# Patient Record
Sex: Male | Born: 1990 | Race: Black or African American | Hispanic: No | Marital: Single | Smoking: Current every day smoker
Health system: Southern US, Community
[De-identification: ages and names within clinical notes are randomized; demographics above are authoritative.]

## PROBLEM LIST (undated history)

## (undated) DIAGNOSIS — J45909 Unspecified asthma, uncomplicated: Secondary | ICD-10-CM

## (undated) HISTORY — PX: APPENDECTOMY: SHX54

---

## 2017-06-17 ENCOUNTER — Encounter (HOSPITAL_COMMUNITY): Payer: Self-pay | Admitting: *Deleted

## 2017-06-17 ENCOUNTER — Emergency Department (HOSPITAL_COMMUNITY)
Admission: EM | Admit: 2017-06-17 | Discharge: 2017-06-17 | Disposition: A | Discharge status: Home / Self Care | Payer: Self-pay | Attending: Emergency Medicine | Admitting: Emergency Medicine

## 2017-06-17 ENCOUNTER — Emergency Department (HOSPITAL_COMMUNITY): Payer: Self-pay

## 2017-06-17 ENCOUNTER — Other Ambulatory Visit: Payer: Self-pay

## 2017-06-17 DIAGNOSIS — F17210 Nicotine dependence, cigarettes, uncomplicated: Secondary | ICD-10-CM | POA: Insufficient documentation

## 2017-06-17 DIAGNOSIS — R0789 Other chest pain: Secondary | ICD-10-CM | POA: Insufficient documentation

## 2017-06-17 LAB — BASIC METABOLIC PANEL
ANION GAP: 10 (ref 5–15)
BUN: 10 mg/dL (ref 6–20)
CALCIUM: 9.3 mg/dL (ref 8.9–10.3)
CO2: 23 mmol/L (ref 22–32)
Chloride: 104 mmol/L (ref 101–111)
Creatinine, Ser: 0.86 mg/dL (ref 0.61–1.24)
GFR calc Af Amer: >60 mL/min (ref >60)
GLUCOSE: 95 mg/dL (ref 65–99)
Potassium: 4.4 mmol/L (ref 3.5–5.1)
Sodium: 137 mmol/L (ref 135–145)

## 2017-06-17 LAB — CBC
HCT: 39.4 % (ref 39.0–52.0)
HEMOGLOBIN: 13.6 g/dL (ref 13.0–17.0)
MCH: 30.3 pg (ref 26.0–34.0)
MCHC: 34.5 g/dL (ref 30.0–36.0)
MCV: 87.8 fL (ref 78.0–100.0)
Platelets: 254 10*3/uL (ref 150–400)
RBC: 4.49 MIL/uL (ref 4.22–5.81)
RDW: 12.8 % (ref 11.5–15.5)
WBC: 7.9 10*3/uL (ref 4.0–10.5)

## 2017-06-17 LAB — I-STAT TROPONIN, ED
TROPONIN I, POC: 0 ng/mL (ref 0.00–0.08)
Troponin i, poc: 0 ng/mL (ref 0.00–0.08)

## 2017-06-17 MED ORDER — GI COCKTAIL ~~LOC~~
30.0000 mL | Freq: Once | ORAL | Status: AC
  Administered 2017-06-17: 30 mL via ORAL
  Filled 2017-06-17: qty 30

## 2017-06-17 MED ORDER — IBUPROFEN 600 MG PO TABS: 600.0000 mg | ORAL_TABLET | Freq: Four times a day (QID) | ORAL | 0 refills | Status: DC | PRN

## 2017-06-17 NOTE — ED Triage Notes (Signed)
Pt reports left side sharp chest pains for several days. Denies cough or sob. Hx of asthma. ekg done at triage, no acute distress is noted.

## 2017-06-17 NOTE — ED Provider Notes (Signed)
[facility] EMERGENCY DEPARTMENT Provider Note   CSN: [id number] Arrival date & time: 06/17/17  1212     History   Chief Complaint Chief Complaint  Patient presents with  . Chest Pain    HPI David Lloyd is a 27 y.o. male.  HPI   27 year old male without any significant past medical history except for asthma presenting with complaints of chest pain.  Patient report for the past week he has had recurrent pain to his left chest.  Described pain as a sharp sensation, episodic, usually lasting for most the day, nonradiating, and change in intensity.  No significant pain currently.  Denies any associated lightheadedness, dizziness, fever, productive cough, shortness of breath, wheezing, nausea, diaphoresis, or arm pain.  He denies any recent strenuous activities or heavy lifting, no recent injury.  He has not had this pain in the past.  He felt that that chest pain is likely related to him quitting smoking recently.  Smoker for approximately 10 years.  Did report remote history of drug use but none recently.  Denies any prior history of PE DVT, no recent surgery, prolonged bed rest, unilateral swelling or calf pain.  Denies any strong family history of cardiac disease or any premature cardiac death.  Denies any specific treatment tried.  History reviewed. No pertinent past medical history.  There are no active problems to display for this patient.   History reviewed. No pertinent surgical history.     Home Medications    Prior to Admission medications   Not on File    Family History History reviewed. No pertinent family history.  Social History Social History   Tobacco Use  . Smoking status: Current Some Day Smoker  Substance Use Topics  . Alcohol use: No    Frequency: Never  . Drug use: No     Allergies   Patient has no known allergies.   Review of Systems Review of Systems  All other systems reviewed and are negative.    Physical  Exam Updated Vital Signs BP 110/77   Pulse 63   Temp 98.9 F (37.2 C) (Oral)   Resp 18   SpO2 97%   Physical Exam  Constitutional: He appears well-developed and well-nourished. No distress.  HENT:  Head: Atraumatic.  Eyes: Conjunctivae are normal.  Neck: Neck supple.  Cardiovascular: Normal rate, regular rhythm, intact distal pulses and normal pulses.  Pulmonary/Chest: Effort normal and breath sounds normal. No accessory muscle usage. He has no decreased breath sounds. He has no wheezes. He has no rhonchi. He has no rales.  Abdominal: Soft.  Musculoskeletal:       Right lower leg: He exhibits no edema.       Left lower leg: He exhibits no edema.  Neurological: He is alert.  Skin: Skin is warm. No rash noted.  Psychiatric: He has a normal mood and affect.  Nursing note and vitals reviewed.    ED Treatments / Results  Labs (all labs ordered are listed, but only abnormal results are displayed) Labs Reviewed  BASIC METABOLIC PANEL  CBC  I-STAT TROPONIN, ED  I-STAT TROPONIN, ED    EKG  EKG Interpretation None     ED ECG REPORT   Date: 06/17/2017  Rate: 78  Rhythm: normal sinus rhythm  QRS Axis: normal  Intervals: normal  ST/T Wave abnormalities: nonspecific T wave changes  Conduction Disutrbances:none  Narrative Interpretation:   Old EKG Reviewed: none available  I have personally reviewed the EKG tracing  and agree with the computerized printout as noted.   Radiology Dg Chest 2 View  Result Date: 06/17/2017 CLINICAL DATA:  [name] left sided chest pains intermittently over the past 2 days; No known heart or respiratory problems; No SOB or any other distress EXAM: CHEST  2 VIEW COMPARISON:  None. FINDINGS: The heart size and mediastinal contours are within normal limits. Both lungs are clear. No pleural effusion or pneumothorax. The visualized skeletal structures are unremarkable. IMPRESSION: Normal chest radiographs. Electronically Signed   By: Amie Portlandavid  Ormond M.D.    On: 06/17/2017 12:57    Procedures Procedures (including critical care time)  Medications Ordered in ED Medications  gi cocktail (Maalox,Lidocaine,Donnatal) (30 mLs Oral Given 06/17/17 1614)     Initial Impression / Assessment and Plan / ED Course  I have reviewed the triage vital signs and the nursing notes.  Pertinent labs & imaging results that were available during my care of the patient were reviewed by me and considered in my medical decision making (see chart for details).     BP 105/66   Pulse 83   Temp 98.9 F (37.2 C) (Oral)   Resp 18   SpO2 98%    Final Clinical Impressions(s) / ED Diagnoses   Final diagnoses:  Atypical chest pain    ED Discharge Orders        Ordered    ibuprofen (ADVIL,MOTRIN) 600 MG tablet  Every 6 hours PRN     06/17/17 1719     2:22 PM Patient here with sharp left-sided chest pain atypical for ACS. PERC nergative, doubt PE.  HEART score of 2, low risk of MACE.  EKG did show non specific T wave changes, will obtain delta trop. Pt otherwise well appearing, in no acute discomfort.  No chest wall pain.   5:18 PM Labs are reassuring, normal delta troponin, chest x-ray is unremarkable, normal heart size no evidence of pneumonia, pleural effusion or pneumothorax.  Patient's pain does not change with position therefore low suspicion for pericarditis. No changes in pain after GI cocktail.  He is afebrile vital signs stable.  At this time encourage patient to find a primary care provider for further evaluation of his condition.  Return precautions discussed.  Will prescribe NSAIDs.   Fayrene Helperran, Oracio Galen, PA-C 06/17/17 1722    Rolland PorterJames, Mark, MD 06/18/17 1131

## 2017-07-31 ENCOUNTER — Ambulatory Visit: Payer: Self-pay | Admitting: Internal Medicine

## 2017-08-05 ENCOUNTER — Encounter (HOSPITAL_COMMUNITY): Payer: Self-pay

## 2017-08-05 ENCOUNTER — Emergency Department (HOSPITAL_COMMUNITY)
Admission: EM | Admit: 2017-08-05 | Discharge: 2017-08-05 | Disposition: A | Discharge status: Home / Self Care | Payer: Self-pay | Attending: Emergency Medicine | Admitting: Emergency Medicine

## 2017-08-05 DIAGNOSIS — R062 Wheezing: Secondary | ICD-10-CM | POA: Insufficient documentation

## 2017-08-05 DIAGNOSIS — J45909 Unspecified asthma, uncomplicated: Secondary | ICD-10-CM | POA: Insufficient documentation

## 2017-08-05 DIAGNOSIS — Z5321 Procedure and treatment not carried out due to patient leaving prior to being seen by health care provider: Secondary | ICD-10-CM | POA: Insufficient documentation

## 2017-08-05 DIAGNOSIS — R05 Cough: Secondary | ICD-10-CM | POA: Insufficient documentation

## 2017-08-05 HISTORY — DX: Unspecified asthma, uncomplicated: J45.909

## 2017-08-05 MED ORDER — ALBUTEROL SULFATE (2.5 MG/3ML) 0.083% IN NEBU
5.0000 mg | INHALATION_SOLUTION | Freq: Once | RESPIRATORY_TRACT | Status: AC
  Administered 2017-08-05: 5 mg via RESPIRATORY_TRACT
  Filled 2017-08-05: qty 6

## 2017-08-05 NOTE — ED Notes (Signed)
Patient called 2x. No answer.

## 2017-08-05 NOTE — ED Triage Notes (Signed)
Pt states that he has a hx of asthma and began coughing and wheezing an hour ago with chest tightness, pt does not have rescue inhaler

## 2017-08-08 NOTE — ED Notes (Signed)
08/08/2017, Follow- up call completed 

## 2017-08-28 ENCOUNTER — Ambulatory Visit: Payer: Self-pay | Admitting: Internal Medicine

## 2017-09-05 ENCOUNTER — Encounter (HOSPITAL_COMMUNITY): Payer: Self-pay

## 2017-09-05 ENCOUNTER — Other Ambulatory Visit: Payer: Self-pay

## 2017-09-05 ENCOUNTER — Emergency Department (HOSPITAL_COMMUNITY)
Admission: EM | Admit: 2017-09-05 | Discharge: 2017-09-05 | Discharge status: Against Medical Advice | Payer: Self-pay | Attending: Emergency Medicine | Admitting: Emergency Medicine

## 2017-09-05 DIAGNOSIS — F1010 Alcohol abuse, uncomplicated: Secondary | ICD-10-CM

## 2017-09-05 DIAGNOSIS — F10920 Alcohol use, unspecified with intoxication, uncomplicated: Secondary | ICD-10-CM

## 2017-09-05 DIAGNOSIS — J45909 Unspecified asthma, uncomplicated: Secondary | ICD-10-CM | POA: Insufficient documentation

## 2017-09-05 DIAGNOSIS — F17210 Nicotine dependence, cigarettes, uncomplicated: Secondary | ICD-10-CM | POA: Insufficient documentation

## 2017-09-05 DIAGNOSIS — F10120 Alcohol abuse with intoxication, uncomplicated: Secondary | ICD-10-CM | POA: Insufficient documentation

## 2017-09-05 NOTE — Discharge Instructions (Signed)
Return here as needed. °

## 2017-09-05 NOTE — ED Triage Notes (Signed)
He was seen by bystanders at a local park to be "passed out". They phoned EMS, who arrived to find the pt. In no distress and in a state of significant intoxication. He is able to briefly stand to get upon our stretcher.

## 2017-09-05 NOTE — ED Notes (Signed)
Pt ambulatory with assistance to restroom

## 2017-09-05 NOTE — ED Notes (Signed)
Patient spoke to PA and was told he would be getting discharged. Patient left without discharge instructions or without telling his nurse. Patient was medically cleared.

## 2017-09-05 NOTE — ED Provider Notes (Signed)
[facility]-EMERGENCY DEPT Provider Note   CSN: [id number] Arrival date & time: 09/05/17  1712     History   Chief Complaint Chief Complaint  Patient presents with  . Alcohol Intoxication    HPI David Lloyd is a 27 y.o. male.  HPI Patient presents to the emergency department with alcohol intoxication.  The patient states that he had 7 beers and passed out on [place] trail [place] local park.  The patient states that someone passed by him and woke him up and said they called EMS for him.  The patient states he had no injuries.  He states that he usually drinks every day.  Patient states he has no complaints at this time. Past Medical History:  Diagnosis Date  . Asthma     There are no active problems to display for this patient.   Past Surgical History:  Procedure Laterality Date  . APPENDECTOMY          Home Medications    Prior to Admission medications   Medication Sig Start Date End Date Taking? Authorizing Provider  ibuprofen (ADVIL,MOTRIN) 200 MG tablet Take 200 mg by mouth every 6 (six) hours as needed for mild pain or moderate pain.   Yes [provider]  ibuprofen (ADVIL,MOTRIN) 600 MG tablet Take 1 tablet (600 mg total) by mouth every 6 (six) hours as needed for mild pain. Patient not taking: Reported on 09/05/2017 06/17/17   Fayrene Helper, PA-C    Family History No family history on file.  Social History Social History   Tobacco Use  . Smoking status: Current Some Day Smoker  . Smokeless tobacco: Never Used  Substance Use Topics  . Alcohol use: Yes    Frequency: Never  . Drug use: No     Allergies   Patient has no known allergies.   Review of Systems Review of Systems All other systems negative except as documented in the HPI. All pertinent positives and negatives as reviewed in the HPI.  Physical Exam Updated Vital Signs BP 126/83 (BP Location: Right Arm)   Pulse 92   Temp 98 F (36.7 C) (Oral)   Resp 17   SpO2  97%   Physical Exam  Constitutional: He is oriented to person, place, and time. He appears well-developed and well-nourished. No distress.  HENT:  Head: Normocephalic and atraumatic.  Mouth/Throat: Oropharynx is clear and moist.  Eyes: Pupils are equal, round, and reactive to light.  Neck: Normal range of motion. Neck supple.  Cardiovascular: Normal rate, regular rhythm and normal heart sounds. Exam reveals no gallop and no friction rub.  No murmur heard. Pulmonary/Chest: Effort normal and breath sounds normal. No respiratory distress. He has no wheezes.  Neurological: He is alert and oriented to person, place, and time. He exhibits normal muscle tone. Coordination normal.  Skin: Skin is warm and dry. Capillary refill takes less than 2 seconds. No rash noted. No erythema.  Psychiatric: He has a normal mood and affect. His behavior is normal.  Nursing note and vitals reviewed.    ED Treatments / Results  Labs (all labs ordered are listed, but only abnormal results are displayed) Labs Reviewed - No data to display  EKG None  Radiology No results found.  Procedures Procedures (including critical care time)  Medications Ordered in ED Medications - No data to display   Initial Impression / Assessment and Plan / ED Course  I have reviewed the triage vital signs and the nursing notes.  Pertinent labs & imaging results that were available during my care of the patient were reviewed by me and considered in my medical decision making (see chart for details).     Patient will be discharged as he is ambulating and mentating appropriately.  Patient is advised to return for any changes in his condition.  Final Clinical Impressions(s) / ED Diagnoses   Final diagnoses:  Alcoholic intoxication without complication [facility])  ETOH abuse    ED Discharge Orders    None       Charlestine Night, Cordelia Poche 09/05/17 2357    Benjiman Core, MD 09/06/17 [phone]

## 2017-09-06 ENCOUNTER — Encounter (HOSPITAL_COMMUNITY): Payer: Self-pay

## 2017-09-06 ENCOUNTER — Emergency Department (HOSPITAL_COMMUNITY)
Admission: EM | Admit: 2017-09-06 | Discharge: 2017-09-06 | Disposition: A | Discharge status: Home / Self Care | Payer: Medicaid Other | Attending: Emergency Medicine | Admitting: Emergency Medicine

## 2017-09-06 ENCOUNTER — Other Ambulatory Visit: Payer: Self-pay

## 2017-09-06 ENCOUNTER — Encounter (HOSPITAL_COMMUNITY): Payer: Self-pay | Admitting: Emergency Medicine

## 2017-09-06 ENCOUNTER — Emergency Department (HOSPITAL_COMMUNITY)
Admission: EM | Admit: 2017-09-06 | Discharge: 2017-09-06 | Disposition: A | Discharge status: Home / Self Care | Payer: Self-pay | Attending: Emergency Medicine | Admitting: Emergency Medicine

## 2017-09-06 DIAGNOSIS — F1010 Alcohol abuse, uncomplicated: Secondary | ICD-10-CM

## 2017-09-06 DIAGNOSIS — F17200 Nicotine dependence, unspecified, uncomplicated: Secondary | ICD-10-CM | POA: Insufficient documentation

## 2017-09-06 DIAGNOSIS — K0889 Other specified disorders of teeth and supporting structures: Secondary | ICD-10-CM

## 2017-09-06 DIAGNOSIS — F17210 Nicotine dependence, cigarettes, uncomplicated: Secondary | ICD-10-CM | POA: Insufficient documentation

## 2017-09-06 DIAGNOSIS — F321 Major depressive disorder, single episode, moderate: Secondary | ICD-10-CM

## 2017-09-06 DIAGNOSIS — J45909 Unspecified asthma, uncomplicated: Secondary | ICD-10-CM | POA: Insufficient documentation

## 2017-09-06 DIAGNOSIS — H1031 Unspecified acute conjunctivitis, right eye: Secondary | ICD-10-CM

## 2017-09-06 DIAGNOSIS — M79641 Pain in right hand: Secondary | ICD-10-CM

## 2017-09-06 MED ORDER — NAPROXEN 500 MG PO TABS: ORAL_TABLET | ORAL | 0 refills | Status: DC

## 2017-09-06 MED ORDER — AMOXICILLIN 500 MG PO CAPS: 500.0000 mg | ORAL_CAPSULE | Freq: Three times a day (TID) | ORAL | 0 refills | Status: DC

## 2017-09-06 MED ORDER — BUPIVACAINE-EPINEPHRINE (PF) 0.5% -1:200000 IJ SOLN
1.8000 mL | Freq: Once | INTRAMUSCULAR | Status: AC
  Administered 2017-09-06: 1.8 mL
  Filled 2017-09-06: qty 1.8

## 2017-09-06 MED ORDER — PENICILLIN V POTASSIUM 500 MG PO TABS
500.0000 mg | ORAL_TABLET | Freq: Once | ORAL | Status: AC
  Administered 2017-09-06: 500 mg via ORAL
  Filled 2017-09-06: qty 1

## 2017-09-06 MED ORDER — PENICILLIN V POTASSIUM 500 MG PO TABS: 500.0000 mg | ORAL_TABLET | Freq: Four times a day (QID) | ORAL | 0 refills | Status: AC

## 2017-09-06 NOTE — ED Notes (Signed)
Pt called to a room x 1 with no response.

## 2017-09-06 NOTE — ED Notes (Signed)
Pt called x 2 with no response. Unable to be located in the lobby

## 2017-09-06 NOTE — ED Triage Notes (Signed)
Per EMS: [facility] c/o toothache x a couple of days.

## 2017-09-06 NOTE — ED Provider Notes (Signed)
[facility]-EMERGENCY DEPT Provider Note: David Dell, MD, FACEP  CSN: [id number] MRN: [id number] ARRIVAL: 09/06/17 at 0153 ROOM: WA18/WA18   CHIEF COMPLAINT  Dental Pain   HISTORY OF PRESENT ILLNESS  09/06/17 5:40 AM David Lloyd is a 27 y.o. male with pain in the left lower third molar which began yesterday.  He rates his pain as a 7 out of 10.  It is making it hard to swallow.  There is adjacent gum swelling as well.  He has not taken anything for his pain.  He does not have a dentist.  There is no associated lymphadenopathy.    Past Medical History:  Diagnosis Date  . Asthma     Past Surgical History:  Procedure Laterality Date  . APPENDECTOMY      History reviewed. No pertinent family history.  Social History   Tobacco Use  . Smoking status: Current Some Day Smoker  . Smokeless tobacco: Never Used  Substance Use Topics  . Alcohol use: Yes    Frequency: Never  . Drug use: No    Prior to Admission medications   Medication Sig Start Date End Date Taking? Authorizing Provider  ibuprofen (ADVIL,MOTRIN) 200 MG tablet Take 200 mg by mouth every 6 (six) hours as needed for mild pain or moderate pain.   Yes [provider]  ibuprofen (ADVIL,MOTRIN) 600 MG tablet Take 1 tablet (600 mg total) by mouth every 6 (six) hours as needed for mild pain. Patient not taking: Reported on 09/05/2017 06/17/17   Fayrene Helper, PA-C    Allergies Patient has no known allergies.   REVIEW OF SYSTEMS  Negative except as noted here or in the History of Present Illness.   PHYSICAL EXAMINATION  Initial Vital Signs Blood pressure 133/79, pulse 68, temperature 98.4 F (36.9 C), temperature source Oral, resp. rate 18, SpO2 100 %.  Examination General: Well-developed, well-nourished male in no acute distress; appearance consistent with age of record HENT: normocephalic; atraumatic; partially impacted third molar; left lower third molar with tenderness to percussion and inflamed  adjacent gum tissue Eyes: pupils equal, round and reactive to light; extraocular muscles intact Neck: supple; no lymphadenopathy Heart: regular rate and rhythm Lungs: clear to auscultation bilaterally Abdomen: soft; nondistended Extremities: No deformity; full range of motion; pulses normal Neurologic: Awake, alert and oriented; motor function intact in all extremities and symmetric; no facial droop Skin: Warm and dry Psychiatric: Normal mood and affect   RESULTS  Summary of this visit's results, reviewed by myself:   EKG Interpretation  Date/Time:    Ventricular Rate:    PR Interval:    QRS Duration:   QT Interval:    QTC Calculation:   R Axis:     Text Interpretation:        Laboratory Studies: No results found for this or any previous visit (from the past 24 hour(s)). Imaging Studies: No results found.  ED COURSE and MDM  Nursing notes and initial vitals signs, including pulse oximetry, reviewed.  Vitals:   09/06/17 0206  BP: 133/79  Pulse: 68  Resp: 18  Temp: 98.4 F (36.9 C)  TempSrc: Oral  SpO2: 100%   Will place on antibiotics and naproxen and refer to oral surgery.  PROCEDURES   DENTAL BLOCK 1.8 milliliters of 0.5% bupivacaine with epinephrine were injected into the buccal fold adjacent to the left lower third molar. The patient tolerated this well and there were no immediate complications. Adequate analgesia was obtained.   ED DIAGNOSES  ICD-10-CM   1. Pain, dental K08.89        Laiyah Exline, MD 09/06/17 0600

## 2017-09-06 NOTE — ED Provider Notes (Signed)
[facility] EMERGENCY DEPARTMENT Provider Note   CSN: [id number] Arrival date & time: 09/06/17  1248     History   Chief Complaint Chief Complaint  Patient presents with  . Dental Pain    HPI David Lloyd is a 27 y.o. male.  The history is provided by the patient.  Dental Pain   This is a new problem. The current episode started 12 to 24 hours ago. The problem occurs constantly. The problem has been gradually worsening. The pain is moderate. The treatment provided moderate relief.   Pt complains of pain from his wisdom tooth.  Pt seen earlier. Pt has not filled RX Past Medical History:  Diagnosis Date  . Asthma     There are no active problems to display for this patient.   Past Surgical History:  Procedure Laterality Date  . APPENDECTOMY          Home Medications    Prior to Admission medications   Medication Sig Start Date End Date Taking? Authorizing Provider  amoxicillin (AMOXIL) 500 MG capsule Take 1 capsule (500 mg total) by mouth 3 (three) times daily. 09/06/17   Elson Areas, PA-C  naproxen (NAPROSYN) 500 MG tablet Take 1 tablet twice daily as needed for tooth pain. 09/06/17   Molpus, John, MD  penicillin v potassium (VEETID) 500 MG tablet Take 1 tablet (500 mg total) by mouth 4 (four) times daily for 7 days. 09/06/17 09/13/17  Molpus, Jonny Ruiz, MD    Family History No family history on file.  Social History Social History   Tobacco Use  . Smoking status: Current Some Day Smoker  . Smokeless tobacco: Never Used  Substance Use Topics  . Alcohol use: Yes    Frequency: Never  . Drug use: No     Allergies   Patient has no known allergies.   Review of Systems Review of Systems  HENT: Positive for dental problem.   All other systems reviewed and are negative.    Physical Exam Updated Vital Signs BP 114/72   Pulse 99   Temp 98.2 F (36.8 C) (Oral)   Resp 16   SpO2 99%   Physical Exam  Constitutional: He appears  well-developed and well-nourished.  HENT:  Head: Normocephalic and atraumatic.  Swelling left gumline tender   Eyes: Conjunctivae are normal.  Neck: Neck supple.  Cardiovascular: Normal rate and regular rhythm.  No murmur heard. Pulmonary/Chest: Effort normal and breath sounds normal. No respiratory distress.  Abdominal: There is no tenderness.  Musculoskeletal: He exhibits no edema.  Neurological: He is alert.  Skin: Skin is warm and dry.  Psychiatric: He has a normal mood and affect.  Nursing note and vitals reviewed.    ED Treatments / Results  Labs (all labs ordered are listed, but only abnormal results are displayed) Labs Reviewed - No data to display  EKG None  Radiology No results found.  Procedures Procedures (including critical care time)  Medications Ordered in ED Medications - No data to display   Initial Impression / Assessment and Plan / ED Course  I have reviewed the triage vital signs and the nursing notes.  Pertinent labs & imaging results that were available during my care of the patient were reviewed by me and considered in my medical decision making (see chart for details).     An After Visit Summary was printed and given to the patient.   Final Clinical Impressions(s) / ED Diagnoses   Final diagnoses:  Pain,  dental    ED Discharge Orders        Ordered    amoxicillin (AMOXIL) 500 MG capsule  3 times daily     09/06/17 1333       Osie Cheeks 09/06/17 1433    Loren Racer, MD 09/07/17 [phone]

## 2017-09-06 NOTE — ED Triage Notes (Signed)
Pt complains of dental pain. Seen for same at [facility] today and received dental block. Pt states he is still having pain and doesn't want to die because he cant eat d/t the pain

## 2017-09-06 NOTE — Discharge Instructions (Addendum)
Schedule to see a dentist for evaluation.   

## 2017-09-22 ENCOUNTER — Emergency Department (HOSPITAL_COMMUNITY)
Admission: EM | Admit: 2017-09-22 | Discharge: 2017-09-22 | Discharge status: Against Medical Advice | Payer: Medicaid Other

## 2017-09-22 NOTE — ED Notes (Signed)
No answer when called for triage 

## 2017-09-23 ENCOUNTER — Emergency Department (HOSPITAL_COMMUNITY): Payer: Medicaid Other

## 2017-09-23 ENCOUNTER — Emergency Department (HOSPITAL_COMMUNITY)
Admission: EM | Admit: 2017-09-23 | Discharge: 2017-09-23 | Disposition: A | Discharge status: Home / Self Care | Payer: Medicaid Other | Attending: Emergency Medicine | Admitting: Emergency Medicine

## 2017-09-23 ENCOUNTER — Encounter (HOSPITAL_COMMUNITY): Payer: Self-pay | Admitting: Nurse Practitioner

## 2017-09-23 DIAGNOSIS — M25572 Pain in left ankle and joints of left foot: Secondary | ICD-10-CM | POA: Insufficient documentation

## 2017-09-23 DIAGNOSIS — Z5321 Procedure and treatment not carried out due to patient leaving prior to being seen by health care provider: Secondary | ICD-10-CM | POA: Insufficient documentation

## 2017-09-23 NOTE — ED Triage Notes (Signed)
Pt is c/o left ankle pain and swelling that has been ongoing for the last 2 weeks.

## 2017-09-23 NOTE — ED Notes (Signed)
Pt called checked for the 3rd time, still no answer no scene in the waiting area.

## 2017-09-23 NOTE — ED Notes (Signed)
Pt called from the waiting to be roomed, no answer, inspected male lobby bathroom and outside pt unavailble

## 2017-09-23 NOTE — ED Notes (Signed)
Bed: WA03 Expected date:  Expected time:  Means of arrival:  Comments: 

## 2017-09-23 NOTE — ED Provider Notes (Cosign Needed)
Patient eloped from the emergency department before provider evaluation.  He was never physically placed in the room.  I did not see or evaluate this patient. Triage note states left ankle pain and swelling that has been ongoing for the last 2 weeks.   I did personally evaluate the images from his ankle x-ray ordered in triage.  No evidence of fracture or dislocation.   Dg Ankle Complete Left  Result Date: 09/23/2017 CLINICAL DATA:  Pain and swelling over the lateral ankle for over 2 weeks. No known injury. EXAM: LEFT ANKLE COMPLETE - 3+ VIEW COMPARISON:  None. FINDINGS: Soft tissue swelling is noted over the lateral malleolus and posterior to the ankle joint. No joint effusion, bone destruction or malalignment is identified. The included metatarsals appear intact. The subtalar joint is unremarkable. IMPRESSION: No acute osseous abnormality. Nonspecific soft tissue swelling over the lateral malleolus and posterior to the ankle joint involving Kager's fat pad. Electronically Signed   By: Tollie Ethavid  Kwon M.D.   On: 09/23/2017 23:25        Andrue Dini, Boyd KerbsHannah, PA-C 09/23/17 2343

## 2017-09-23 NOTE — ED Notes (Signed)
Rechecked in the waiting pt still unavailable.

## 2017-09-24 ENCOUNTER — Emergency Department (HOSPITAL_COMMUNITY)
Admission: EM | Admit: 2017-09-24 | Discharge: 2017-09-24 | Discharge status: Absent without leave | Payer: Medicaid Other

## 2017-09-24 NOTE — ED Notes (Signed)
CALLED PT FOR TRIAGE X1 NO RESPONSE 

## 2017-09-25 ENCOUNTER — Emergency Department (HOSPITAL_COMMUNITY)
Admission: EM | Admit: 2017-09-25 | Discharge: 2017-09-25 | Disposition: A | Discharge status: Home / Self Care | Payer: Medicaid Other | Attending: Emergency Medicine | Admitting: Emergency Medicine

## 2017-09-25 ENCOUNTER — Encounter (HOSPITAL_COMMUNITY): Payer: Self-pay

## 2017-09-25 DIAGNOSIS — F17200 Nicotine dependence, unspecified, uncomplicated: Secondary | ICD-10-CM | POA: Insufficient documentation

## 2017-09-25 DIAGNOSIS — Z79899 Other long term (current) drug therapy: Secondary | ICD-10-CM | POA: Insufficient documentation

## 2017-09-25 DIAGNOSIS — R45851 Suicidal ideations: Secondary | ICD-10-CM

## 2017-09-25 DIAGNOSIS — F32A Depression, unspecified: Secondary | ICD-10-CM

## 2017-09-25 DIAGNOSIS — J45909 Unspecified asthma, uncomplicated: Secondary | ICD-10-CM | POA: Insufficient documentation

## 2017-09-25 DIAGNOSIS — R440 Auditory hallucinations: Secondary | ICD-10-CM

## 2017-09-25 DIAGNOSIS — F329 Major depressive disorder, single episode, unspecified: Secondary | ICD-10-CM

## 2017-09-25 DIAGNOSIS — F209 Schizophrenia, unspecified: Secondary | ICD-10-CM

## 2017-09-25 LAB — COMPREHENSIVE METABOLIC PANEL
ALK PHOS: 76 U/L (ref 38–126)
ALT: 19 U/L (ref 17–63)
AST: 21 U/L (ref 15–41)
Albumin: 4.7 g/dL (ref 3.5–5.0)
Anion gap: 7 (ref 5–15)
BUN: 15 mg/dL (ref 6–20)
CALCIUM: 9.8 mg/dL (ref 8.9–10.3)
CO2: 28 mmol/L (ref 22–32)
CREATININE: 0.97 mg/dL (ref 0.61–1.24)
Chloride: 103 mmol/L (ref 101–111)
Glucose, Bld: 98 mg/dL (ref 65–99)
Potassium: 4.2 mmol/L (ref 3.5–5.1)
SODIUM: 138 mmol/L (ref 135–145)
Total Bilirubin: 0.4 mg/dL (ref 0.3–1.2)
Total Protein: 7.9 g/dL (ref 6.5–8.1)

## 2017-09-25 LAB — CBC
HCT: 45.2 % (ref 39.0–52.0)
Hemoglobin: 15.2 g/dL (ref 13.0–17.0)
MCH: 30.3 pg (ref 26.0–34.0)
MCHC: 33.6 g/dL (ref 30.0–36.0)
MCV: 90.2 fL (ref 78.0–100.0)
PLATELETS: 296 10*3/uL (ref 150–400)
RBC: 5.01 MIL/uL (ref 4.22–5.81)
RDW: 13.3 % (ref 11.5–15.5)
WBC: 8.9 10*3/uL (ref 4.0–10.5)

## 2017-09-25 LAB — RAPID URINE DRUG SCREEN, HOSP PERFORMED
Amphetamines: NOT DETECTED
BENZODIAZEPINES: NOT DETECTED
Barbiturates: NOT DETECTED
Cocaine: NOT DETECTED
Opiates: NOT DETECTED
Tetrahydrocannabinol: NOT DETECTED

## 2017-09-25 LAB — ETHANOL: Alcohol, Ethyl (B): <10 mg/dL (ref <10)

## 2017-09-25 MED ORDER — IBUPROFEN 200 MG PO TABS
600.0000 mg | ORAL_TABLET | Freq: Once | ORAL | Status: AC
  Administered 2017-09-25: 600 mg via ORAL
  Filled 2017-09-25: qty 3

## 2017-09-25 MED ORDER — ALUM & MAG HYDROXIDE-SIMETH 200-200-20 MG/5ML PO SUSP: 30.0000 mL | Freq: Four times a day (QID) | ORAL | Status: DC | PRN

## 2017-09-25 NOTE — ED Notes (Signed)
Pt d/c home per MD order. Discharge summary reviewed with pt, pt verbalizes understanding. Pt denies SI/HI/AVH. Personal property returned to pt. Bus pass provided per pt request. Pt signed e-signature. Ambulatory off unit with [facility].

## 2017-09-25 NOTE — ED Notes (Signed)
Pt to room #40, pt reports "a breakdown" is what brought him to the hospital. Pt denies SI/HI/VH. Endorsing AH. Reports hearing "whispers" Pt reports he is non complaint with medications at home.  Encouragement and support provided. Special checks q 15 mins in place for safety, Video monitoring in place. Will continue to monitor.

## 2017-09-25 NOTE — ED Notes (Signed)
Patient reports   SI/HI/AVH. Plan of care discussed. Encouragement and support provided and safety maintain.
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
## 2017-09-25 NOTE — ED Notes (Signed)
Bed: WTR5 Expected date:  Expected time:  Means of arrival:  Comments: 

## 2017-09-25 NOTE — ED Notes (Signed)
Patient was reassessed and now reports intermittent SI and AH. TTS in progress.

## 2017-09-25 NOTE — ED Notes (Signed)
Bed: WBH40 Expected date:  Expected time:  Means of arrival:  Comments: 26 

## 2017-09-25 NOTE — ED Provider Notes (Signed)
[facility]-EMERGENCY DEPT Provider Note   CSN: [id number] Arrival date & time: 09/25/17  0220     History   Chief Complaint Chief Complaint  Patient presents with  . Medical Clearance    HPI David Lloyd is a 27 y.o. male with a hx of asthma, schizophrenia, anxiety, depression presents to the Emergency Department complaining of gradual, persistent, progressively worsening auditory hallucinations onset several days ago.  Patient reports he has been hospitalized several times for his schizophrenia but does not take daily medications.  He states he hears whispering voices in his head which curse at him and hurl insults but do not give him commands.  He denies suicidal or homicidal ideations.  Denies visual hallucinations.  Patient reports his last hospitalization for psychiatric problems was in November 2018 in [place].  Patient also complains of left ankle swelling and pain.  He denies known injury.  He reports he has seen other people with swollen joints and became concerned.  He denies fevers or chills, nausea or vomiting.  He reports no redness or wounds.  He reports he has been ambulatory without difficulty but that does increase his pain.  No treatments prior to arrival.  No alleviating factors.  The history is provided by the patient and medical records. No language interpreter was used.    Past Medical History:  Diagnosis Date  . Asthma     There are no active problems to display for this patient.   Past Surgical History:  Procedure Laterality Date  . APPENDECTOMY          Home Medications    Prior to Admission medications   Medication Sig Start Date End Date Taking? Authorizing Provider  amoxicillin (AMOXIL) 500 MG capsule Take 1 capsule (500 mg total) by mouth 3 (three) times daily. Patient not taking: Reported on 09/25/2017 09/06/17   Elson Areas, PA-C  naproxen (NAPROSYN) 500 MG tablet Take 1 tablet twice daily as needed for tooth  pain. Patient not taking: Reported on 09/25/2017 09/06/17   Molpus, Jonny Ruiz, MD    Family History History reviewed. No pertinent family history.  Social History Social History   Tobacco Use  . Smoking status: Current Some Day Smoker  . Smokeless tobacco: Never Used  Substance Use Topics  . Alcohol use: Yes    Frequency: Never  . Drug use: No     Allergies   Patient has no known allergies.   Review of Systems Review of Systems  Constitutional: Negative for appetite change, diaphoresis, fatigue, fever and unexpected weight change.  HENT: Negative for mouth sores.   Eyes: Negative for visual disturbance.  Respiratory: Negative for cough, chest tightness, shortness of breath and wheezing.   Cardiovascular: Negative for chest pain.  Gastrointestinal: Negative for abdominal pain, constipation, diarrhea, nausea and vomiting.  Endocrine: Negative for polydipsia, polyphagia and polyuria.  Genitourinary: Negative for dysuria, frequency, hematuria and urgency.  Musculoskeletal: Positive for arthralgias and joint swelling. Negative for back pain and neck stiffness.  Skin: Negative for rash.  Allergic/Immunologic: Negative for immunocompromised state.  Neurological: Negative for syncope, light-headedness and headaches.  Hematological: Does not bruise/bleed easily.  Psychiatric/Behavioral: Positive for dysphoric mood and hallucinations. Negative for sleep disturbance. The patient is nervous/anxious.      Physical Exam Updated Vital Signs BP 126/87 (BP Location: Right Arm)   Pulse 77   Temp 98.1 F (36.7 C) (Oral)   Resp 16   SpO2 100%   Physical Exam  Constitutional: He appears well-developed  and well-nourished. No distress.  Awake, alert, nontoxic appearance  HENT:  Head: Normocephalic and atraumatic.  Mouth/Throat: Oropharynx is clear and moist. No oropharyngeal exudate.  Eyes: Conjunctivae are normal. No scleral icterus.  Neck: Normal range of motion. Neck supple.   Cardiovascular: Normal rate, regular rhythm and intact distal pulses.  Pulmonary/Chest: Effort normal and breath sounds normal. No respiratory distress. He has no wheezes.  Equal chest expansion  Abdominal: Soft. Bowel sounds are normal. He exhibits no mass. There is no tenderness. There is no rebound and no guarding.  Musculoskeletal: Normal range of motion. He exhibits no edema.       Left ankle: He exhibits swelling. He exhibits normal range of motion, no ecchymosis and no deformity. No tenderness. Achilles tendon exhibits no pain, no defect and normal Thompson's test results.  Neurological: He is alert.  Speech is clear and goal oriented Moves extremities without ataxia  Skin: Skin is warm and dry. He is not diaphoretic.  Psychiatric: He has a normal mood and affect. He is actively hallucinating.  Patient reports hearing whispers in his head.  He reports they curse at him and say insults but he denies command hallucinations.  Nursing note and vitals reviewed.    ED Treatments / Results  Labs (all labs ordered are listed, but only abnormal results are displayed) Labs Reviewed  COMPREHENSIVE METABOLIC PANEL  ETHANOL  CBC  RAPID URINE DRUG SCREEN, HOSP PERFORMED    Radiology Dg Ankle Complete Left  Result Date: 09/23/2017 CLINICAL DATA:  Pain and swelling over the lateral ankle for over 2 weeks. No known injury. EXAM: LEFT ANKLE COMPLETE - 3+ VIEW COMPARISON:  None. FINDINGS: Soft tissue swelling is noted over the lateral malleolus and posterior to the ankle joint. No joint effusion, bone destruction or malalignment is identified. The included metatarsals appear intact. The subtalar joint is unremarkable. IMPRESSION: No acute osseous abnormality. Nonspecific soft tissue swelling over the lateral malleolus and posterior to the ankle joint involving Kager's fat pad. Electronically Signed   By: Tollie Ethavid  Kwon M.D.   On: 09/23/2017 23:25    Procedures Procedures (including critical care  time)  Medications Ordered in ED Medications  alum & mag hydroxide-simeth (MAALOX/MYLANTA) 200-200-20 MG/5ML suspension 30 mL (has no administration in time range)  ibuprofen (ADVIL,MOTRIN) tablet 600 mg (600 mg Oral Given 09/25/17 0546)     Initial Impression / Assessment and Plan / ED Course  I have reviewed the triage vital signs and the nursing notes.  Pertinent labs & imaging results that were available during my care of the patient were reviewed by me and considered in my medical decision making (see chart for details).       Patient X-Ray negative for obvious fracture or dislocation.  Evaluated these images.  Pain managed in ED. ASO placed.   Patient also complaining of auditory hallucinations, worsening anxiety and depression.  No suicidal or homicidal ideation.  At this time there is no medical emergency for which intervention is required.  He will have TTS evaluation.  6:19 AM Patient now admitting to intermittent suicidal ideation along with his auditory hallucinations.  He will need a Psychologist, educationalone-on-one sitter.  Final Clinical Impressions(s) / ED Diagnoses   Final diagnoses:  Depression, unspecified depression type  Suicidal ideation  Auditory hallucinations    ED Discharge Orders    None       Mardene SayerMuthersbaugh, Boyd KerbsHannah, PA-C 09/25/17 [id number]    Shon BatonHorton, Courtney F, MD 09/27/17 [phone]

## 2017-09-25 NOTE — ED Notes (Signed)
Pt denies SI/HI 

## 2017-09-25 NOTE — BH Assessment (Signed)
[facility] Assessment Progress Note  Per Jacqueline Norman, DO, this pt does not require psychiatric hospitalization at this time.  Pt is to be discharged from [facility] with recommendation to follow up with [facility].  This has been included in pt's discharge instructions.  Pt's nurse, Ashley, has been notified.  Nicole Defino, MA Triage Specialist [phone]     

## 2017-09-25 NOTE — ED Triage Notes (Signed)
Pt states that he was here earlier and had an xray but didn't wait on the results Pt also states that he needs to talk to someone about his anxiety and depression He's not currently on any meds
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
## 2017-09-25 NOTE — Discharge Instructions (Signed)
For your mental health needs, you are advised to follow up with [facility].  New and returning patients are seen at their walk-in clinic.  Walk-in hours are Monday - Friday from 8:00 am - 3:00 pm.  Walk-in patients are seen on a first come, first served basis.  Try to arrive as early as possible for he best chance of being seen the same day: ° °     [facility] °     [address] °     [place],  [address] °     [phone] °

## 2018-01-19 ENCOUNTER — Emergency Department (HOSPITAL_COMMUNITY): Payer: Medicaid Other

## 2018-01-19 ENCOUNTER — Other Ambulatory Visit: Payer: Self-pay

## 2018-01-19 ENCOUNTER — Emergency Department (HOSPITAL_COMMUNITY)
Admission: EM | Admit: 2018-01-19 | Discharge: 2018-01-20 | Disposition: A | Discharge status: Home / Self Care | Payer: Medicaid Other | Attending: Emergency Medicine | Admitting: Emergency Medicine

## 2018-01-19 ENCOUNTER — Encounter (HOSPITAL_COMMUNITY): Payer: Self-pay | Admitting: *Deleted

## 2018-01-19 DIAGNOSIS — J45909 Unspecified asthma, uncomplicated: Secondary | ICD-10-CM | POA: Insufficient documentation

## 2018-01-19 DIAGNOSIS — F17200 Nicotine dependence, unspecified, uncomplicated: Secondary | ICD-10-CM | POA: Insufficient documentation

## 2018-01-19 DIAGNOSIS — R079 Chest pain, unspecified: Secondary | ICD-10-CM | POA: Insufficient documentation

## 2018-01-19 LAB — CBC
HEMATOCRIT: 42.6 % (ref 39.0–52.0)
HEMOGLOBIN: 14.2 g/dL (ref 13.0–17.0)
MCH: 30 pg (ref 26.0–34.0)
MCHC: 33.3 g/dL (ref 30.0–36.0)
MCV: 89.9 fL (ref 78.0–100.0)
Platelets: 291 10*3/uL (ref 150–400)
RBC: 4.74 MIL/uL (ref 4.22–5.81)
RDW: 12.7 % (ref 11.5–15.5)
WBC: 8.9 10*3/uL (ref 4.0–10.5)

## 2018-01-19 LAB — BASIC METABOLIC PANEL
ANION GAP: 8 (ref 5–15)
BUN: 12 mg/dL (ref 6–20)
CHLORIDE: 105 mmol/L (ref 98–111)
CO2: 24 mmol/L (ref 22–32)
Calcium: 9.2 mg/dL (ref 8.9–10.3)
Creatinine, Ser: 1.01 mg/dL (ref 0.61–1.24)
GFR calc Af Amer: >60 mL/min (ref >60)
GLUCOSE: 122 mg/dL — AB (ref 70–99)
Potassium: 3.4 mmol/L — ABNORMAL LOW (ref 3.5–5.1)
SODIUM: 137 mmol/L (ref 135–145)

## 2018-01-19 LAB — I-STAT TROPONIN, ED: Troponin i, poc: 0 ng/mL (ref 0.00–0.08)

## 2018-01-19 MED ORDER — HYDROCODONE-ACETAMINOPHEN 5-325 MG PO TABS
1.0000 | ORAL_TABLET | Freq: Once | ORAL | Status: AC
  Administered 2018-01-19: 1 via ORAL
  Filled 2018-01-19: qty 1

## 2018-01-19 NOTE — ED Provider Notes (Signed)
[facility] Emergency Department Provider Note MRN:  [id number]  Arrival date & time: 01/20/18     Chief Complaint   Chest Pain   History of Present Illness   David Lloyd is a 27 y.o. year-old male with a history of asthma presenting to the ED with chief complaint of chest pain.  Sudden onset chest pain 1 week ago.  The pain is located in the left side of the chest and left thoracic back.  Pain is been intermittent for 1 week.  Not related to motion, not worse with deep breathing, no associated shortness of breath.  No recent fevers or coughs, no abdominal pain.  Recent train ride estimated 1 to 2 hours 1 to 2 weeks ago.  Sensation of proximal bilateral leg pain and swelling this morning.  Review of Systems  A complete 10 system review of systems was obtained and all systems are negative except as noted in the HPI and PMH.   Patient's Health History    Past Medical History:  Diagnosis Date  . Asthma     Past Surgical History:  Procedure Laterality Date  . APPENDECTOMY      No family history on file.  Social History   Socioeconomic History  . Marital status: Single    Spouse name: Not on file  . Number of children: Not on file  . Years of education: Not on file  . Highest education level: Not on file  Occupational History  . Not on file  Social Needs  . Financial resource strain: Not on file  . Food insecurity:    Worry: Not on file    Inability: Not on file  . Transportation needs:    Medical: Not on file    Non-medical: Not on file  Tobacco Use  . Smoking status: Current Some Day Smoker  . Smokeless tobacco: Never Used  Substance and Sexual Activity  . Alcohol use: Yes    Frequency: Never  . Drug use: No  . Sexual activity: Not on file  Lifestyle  . Physical activity:    Days per week: Not on file    Minutes per session: Not on file  . Stress: Not on file  Relationships  . Social connections:    Talks on phone: Not on file    Gets  together: Not on file    Attends religious service: Not on file    Active member of club or organization: Not on file    Attends meetings of clubs or organizations: Not on file    Relationship status: Not on file  . Intimate partner violence:    Fear of current or ex partner: Not on file    Emotionally abused: Not on file    Physically abused: Not on file    Forced sexual activity: Not on file  Other Topics Concern  . Not on file  Social History Narrative  . Not on file     Physical Exam  Vital Signs and Nursing Notes reviewed Vitals:   01/19/18 2135 01/19/18 2145  BP: 118/73 123/79  Pulse: 82 80  Resp: 16 (!) 25  Temp: 98.6 F (37 C)   SpO2: 99% 99%    CONSTITUTIONAL: Well-appearing, NAD NEURO:  Alert and oriented x 3, no focal deficits EYES:  eyes equal and reactive ENT/NECK:  no LAD, no JVD CARDIO: Regular rate, well-perfused, normal S1 and S2 PULM:  CTAB no wheezing or rhonchi GI/GU:  normal bowel sounds, non-distended, non-tender MSK/SPINE:  No gross deformities, no edema, no tenderness to palpation to the chest or thoracic back SKIN:  no rash, atraumatic PSYCH:  Appropriate speech and behavior  Diagnostic and Interventional Summary    EKG Interpretation  Date/Time:  Saturday January 19 2018 21:29:29 EDT Ventricular Rate:  87 PR Interval:  134 QRS Duration: 88 QT Interval:  346 QTC Calculation: 416 R Axis:   75 Text Interpretation:  Normal sinus rhythm Nonspecific T wave abnormality Abnormal ECG Confirmed by Kennis Carina [phone]) on 01/19/2018 10:05:29 PM      Labs Reviewed  BASIC METABOLIC PANEL - Abnormal; Notable for the following components:      Result Value   Potassium 3.4 (*)    Glucose, Bld 122 (*)    All other components within normal limits  CBC  D-DIMER, QUANTITATIVE (NOT AT [facility]  I-STAT TROPONIN, ED    DG Chest 2 View  Final Result      Medications  HYDROcodone-acetaminophen (NORCO/VICODIN) 5-325 MG per tablet 1 tablet (1 tablet  Oral Given 01/19/18 2234)     Procedures Critical Care  ED Course and Medical Decision Making  I have reviewed the triage vital signs and the nursing notes.  Pertinent labs & imaging results that were available during my care of the patient were reviewed by me and considered in my medical decision making (see below for details).  Favoring MSK but cannot exclude pulmonary embolism in this 27 year old male with recent train ride, question of leg pain this morning.  Low risk, will screen with d-dimer.  Inconsistent with cardiac etiology, EKG on concerning, troponin negative.  Signed out to Dr. Preston Fleeting at shift change.  Elmer Sow. Pilar Plate, MD [facility] Emergency Medicine [facility] [email or web address]  Final Clinical Impressions(s) / ED Diagnoses     ICD-10-CM   1. Chest pain, unspecified type R07.9     ED Discharge Orders    None         Sabas Sous, MD 01/20/18 0021

## 2018-01-19 NOTE — ED Triage Notes (Signed)
Pt had been sharp chest pain for the past week to the L side of chest

## 2018-01-20 LAB — D-DIMER, QUANTITATIVE (NOT AT ARMC)

## 2018-01-20 NOTE — ED Notes (Signed)
Patient verbalizes understanding of discharge instructions. Opportunity for questioning and answers were provided. Armband removed by staff, pt discharged from ED home via POV.  

## 2018-01-20 NOTE — ED Provider Notes (Signed)
Care assumed from Dr. Pilar Plate, patient presented with left-sided chest pain, initial ED evaluation unremarkable.  D-dimer was ordered because of recent travel, patient signed out to me pending results of d-dimer.  D-dimer has come back normal.  Patient is resting comfortably at this point.  He is felt to be safe for discharge.  Advised to return should symptoms worsen.  Results for orders placed or performed during the hospital encounter of 01/19/18  Basic metabolic panel  Result Value Ref Range   Sodium 137 135 - 145 mmol/L   Potassium 3.4 (L) 3.5 - 5.1 mmol/L   Chloride 105 98 - 111 mmol/L   CO2 24 22 - 32 mmol/L   Glucose, Bld 122 (H) 70 - 99 mg/dL   BUN 12 6 - 20 mg/dL   Creatinine, Ser 1.61 0.61 - 1.24 mg/dL   Calcium 9.2 8.9 - 09.6 mg/dL   GFR calc non Af Amer >60 >60 mL/min   GFR calc Af Amer >60 >60 mL/min   Anion gap 8 5 - 15  CBC  Result Value Ref Range   WBC 8.9 4.0 - 10.5 K/uL   RBC 4.74 4.22 - 5.81 MIL/uL   Hemoglobin 14.2 13.0 - 17.0 g/dL   HCT 04.5 40.9 - 81.1 %   MCV 89.9 78.0 - 100.0 fL   MCH 30.0 26.0 - 34.0 pg   MCHC 33.3 30.0 - 36.0 g/dL   RDW 91.4 78.2 - 95.6 %   Platelets 291 150 - 400 K/uL  D-dimer, quantitative (not at [facility])  Result Value Ref Range   D-Dimer, Quant <0.27 0.00 - 0.50 ug/mL-FEU  I-stat troponin, ED  Result Value Ref Range   Troponin i, poc 0.00 0.00 - 0.08 ng/mL   Comment 3           Dg Chest 2 View  Result Date: 01/19/2018 CLINICAL DATA:  Left-sided chest pain. EXAM: CHEST - 2 VIEW COMPARISON:  June 17, 2017 FINDINGS: The heart size and mediastinal contours are within normal limits. Both lungs are clear. The visualized skeletal structures are unremarkable. IMPRESSION: No active cardiopulmonary disease. Electronically Signed   By: Gerome Sam III M.D   On: 01/19/2018 22:13      Dione Booze, MD 01/20/18 0140

## 2018-01-20 NOTE — Discharge Instructions (Addendum)
You were evaluated in the Emergency Department and after careful evaluation, we did not find any emergent condition requiring admission or further testing in the hospital. ° °Please return to the Emergency Department if you experience any worsening of your condition.  We encourage you to follow up with a primary care provider.  Thank you for allowing us to be a part of your care. °

## 2018-06-19 ENCOUNTER — Other Ambulatory Visit: Payer: Self-pay

## 2018-06-19 ENCOUNTER — Emergency Department (HOSPITAL_COMMUNITY)
Admission: EM | Admit: 2018-06-19 | Discharge: 2018-06-20 | Disposition: A | Discharge status: Home / Self Care | Payer: Self-pay | Attending: Emergency Medicine | Admitting: Emergency Medicine

## 2018-06-19 ENCOUNTER — Encounter (HOSPITAL_COMMUNITY): Payer: Self-pay | Admitting: *Deleted

## 2018-06-19 DIAGNOSIS — H5789 Other specified disorders of eye and adnexa: Secondary | ICD-10-CM | POA: Insufficient documentation

## 2018-06-19 DIAGNOSIS — R4589 Other symptoms and signs involving emotional state: Secondary | ICD-10-CM | POA: Insufficient documentation

## 2018-06-19 DIAGNOSIS — F329 Major depressive disorder, single episode, unspecified: Secondary | ICD-10-CM | POA: Insufficient documentation

## 2018-06-19 DIAGNOSIS — F17210 Nicotine dependence, cigarettes, uncomplicated: Secondary | ICD-10-CM | POA: Insufficient documentation

## 2018-06-19 DIAGNOSIS — F1020 Alcohol dependence, uncomplicated: Secondary | ICD-10-CM | POA: Insufficient documentation

## 2018-06-19 DIAGNOSIS — J45909 Unspecified asthma, uncomplicated: Secondary | ICD-10-CM | POA: Insufficient documentation

## 2018-06-19 DIAGNOSIS — F209 Schizophrenia, unspecified: Secondary | ICD-10-CM | POA: Diagnosis present

## 2018-06-19 DIAGNOSIS — R45851 Suicidal ideations: Secondary | ICD-10-CM

## 2018-06-19 NOTE — ED Notes (Signed)
Bed: [facility] Expected date:  Expected time:  Means of arrival:  Comments: 

## 2018-06-19 NOTE — ED Notes (Signed)
Bed: [facility] Expected date:  Expected time:  Means of arrival:  Comments: 

## 2018-06-20 ENCOUNTER — Emergency Department (HOSPITAL_COMMUNITY): Payer: Self-pay

## 2018-06-20 LAB — ACETAMINOPHEN LEVEL: Acetaminophen (Tylenol), Serum: <10 ug/mL — ABNORMAL LOW (ref 10–30)

## 2018-06-20 LAB — CBC
HCT: 40.3 % (ref 39.0–52.0)
HEMOGLOBIN: 13.1 g/dL (ref 13.0–17.0)
MCH: 30.5 pg (ref 26.0–34.0)
MCHC: 32.5 g/dL (ref 30.0–36.0)
MCV: 93.7 fL (ref 80.0–100.0)
Platelets: 243 10*3/uL (ref 150–400)
RBC: 4.3 MIL/uL (ref 4.22–5.81)
RDW: 12.8 % (ref 11.5–15.5)
WBC: 7.9 10*3/uL (ref 4.0–10.5)
nRBC: 0 % (ref 0.0–0.2)

## 2018-06-20 LAB — COMPREHENSIVE METABOLIC PANEL
ALK PHOS: 69 U/L (ref 38–126)
ALT: 16 U/L (ref 0–44)
ANION GAP: 8 (ref 5–15)
AST: 15 U/L (ref 15–41)
Albumin: 4 g/dL (ref 3.5–5.0)
BILIRUBIN TOTAL: 0.6 mg/dL (ref 0.3–1.2)
BUN: 12 mg/dL (ref 6–20)
CALCIUM: 9 mg/dL (ref 8.9–10.3)
CO2: 22 mmol/L (ref 22–32)
Chloride: 109 mmol/L (ref 98–111)
Creatinine, Ser: 0.93 mg/dL (ref 0.61–1.24)
GFR calc non Af Amer: >60 mL/min (ref >60)
Glucose, Bld: 90 mg/dL (ref 70–99)
POTASSIUM: 3.6 mmol/L (ref 3.5–5.1)
Sodium: 139 mmol/L (ref 135–145)
TOTAL PROTEIN: 6.8 g/dL (ref 6.5–8.1)

## 2018-06-20 LAB — RAPID URINE DRUG SCREEN, HOSP PERFORMED
Amphetamines: NOT DETECTED
Barbiturates: NOT DETECTED
Benzodiazepines: NOT DETECTED
Cocaine: NOT DETECTED
Opiates: NOT DETECTED
Tetrahydrocannabinol: NOT DETECTED

## 2018-06-20 LAB — SALICYLATE LEVEL: Salicylate Lvl: <7 mg/dL (ref 2.8–30.0)

## 2018-06-20 LAB — ETHANOL: Alcohol, Ethyl (B): 17 mg/dL — ABNORMAL HIGH (ref <10)

## 2018-06-20 MED ORDER — LORAZEPAM 1 MG PO TABS: 0.0000 mg | ORAL_TABLET | Freq: Two times a day (BID) | ORAL | Status: DC

## 2018-06-20 MED ORDER — LORAZEPAM 2 MG/ML IJ SOLN: 0.0000 mg | Freq: Two times a day (BID) | INTRAMUSCULAR | Status: DC

## 2018-06-20 MED ORDER — IBUPROFEN 200 MG PO TABS: 600.0000 mg | ORAL_TABLET | Freq: Three times a day (TID) | ORAL | Status: DC | PRN

## 2018-06-20 MED ORDER — LORAZEPAM 1 MG PO TABS: 0.0000 mg | ORAL_TABLET | Freq: Four times a day (QID) | ORAL | Status: DC

## 2018-06-20 MED ORDER — ALUM & MAG HYDROXIDE-SIMETH 200-200-20 MG/5ML PO SUSP: 30.0000 mL | Freq: Four times a day (QID) | ORAL | Status: DC | PRN

## 2018-06-20 MED ORDER — NICOTINE 21 MG/24HR TD PT24: 21.0000 mg | MEDICATED_PATCH | Freq: Every day | TRANSDERMAL | Status: DC

## 2018-06-20 MED ORDER — VITAMIN B-1 100 MG PO TABS: 100.0000 mg | ORAL_TABLET | Freq: Every day | ORAL | Status: DC

## 2018-06-20 MED ORDER — LORAZEPAM 2 MG/ML IJ SOLN: 0.0000 mg | Freq: Four times a day (QID) | INTRAMUSCULAR | Status: DC

## 2018-06-20 MED ORDER — ERYTHROMYCIN 5 MG/GM OP OINT
TOPICAL_OINTMENT | Freq: Once | OPHTHALMIC | Status: AC
  Administered 2018-06-20: 03:00:00 via OPHTHALMIC
  Filled 2018-06-20: qty 4

## 2018-06-20 MED ORDER — THIAMINE HCL 100 MG/ML IJ SOLN: 100.0000 mg | Freq: Every day | INTRAMUSCULAR | Status: DC

## 2018-06-20 MED ORDER — ERYTHROMYCIN 5 MG/GM OP OINT
TOPICAL_OINTMENT | Freq: Four times a day (QID) | OPHTHALMIC | Status: DC
  Administered 2018-06-20: 10:00:00 via OPHTHALMIC
  Filled 2018-06-20: qty 1

## 2018-06-20 NOTE — ED Notes (Signed)
Pt A&O x 3, sleeping at present. Calm & cooperative at present.  Monitoring for safety, Q 15 min checks in effect, no distress noted.

## 2018-06-20 NOTE — Progress Notes (Signed)
Clinician called in on tele-assessment machine multiple times (0143, 0155) to complete BH Assessment but there has been no answer. Will try again at a later time.

## 2018-06-20 NOTE — BHH Suicide Risk Assessment (Cosign Needed)
Suicide Risk Assessment  Discharge Assessment   [facility] Discharge Suicide Risk Assessment   Principal Problem: Schizophrenia [facility]) Discharge Diagnoses: Principal Problem:   Schizophrenia (HCC)   Total Time spent with patient: 20 minutes  Musculoskeletal: Strength & Muscle Tone: within normal limits Gait & Station: normal Patient leans: N/A  Psychiatric Specialty Exam:   Blood pressure 121/76, pulse 75, temperature 98.2 F (36.8 C), temperature source Oral, resp. rate 16, height 5\' 5"  (1.651 m), weight 70.3 kg, SpO2 100 %.Body mass index is 25.79 kg/m.  General Appearance: Casual  Eye Contact::  Fair  Speech:  Clear and Coherent409  Volume:  Decreased  Mood:  Depressed, Hopeless and hopeless feeling for the past week  Affect:  Congruent and Depressed  Thought Process:  Coherent, Goal Directed, Linear and Descriptions of Associations: Intact  Orientation:  Full (Time, Place, and Person)  Thought Content:  Logical  Suicidal Thoughts:  No  Homicidal Thoughts:  No  Memory:  Immediate;   Good Recent;   Good Remote;   Fair  Judgement:  Fair  Insight:  Fair  Psychomotor Activity:  Normal  Concentration:  Good  Recall:  Good  Fund of Knowledge:Good  Language: Good  Akathisia:  No  Handed:  Right  AIMS (if indicated):     Assets:  Architect Housing Vocational/Educational  Sleep:     Cognition: WNL  ADL's:  Intact   Mental Status Per Nursing Assessment::   On Admission:   [facility] with hopelessness and vague suicidal thoughts without a plan. He denies having any suicidal thoughts today. Pt has been hospitalized in a psychiatric hospital in the past but he stated they did not discharge him on medications and he does not know if they gave him a diagnosis. He is a Archivist at [employer] He is able to contract for safety on discharge. He would like resources for outpatient therapy/psychiatry. His UDS was negative, BAL 17.  He stated he does not use drugs but does use CBD gummies on occasion. He stated he drank yesterday but also does nit do this often either. 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 Pt is psychiatrically clear.   Demographic Factors:  Male, Adolescent or young adult and Low socioeconomic status  Loss Factors: Financial problems/change in socioeconomic status  Historical Factors: Family history of mental illness or substance abuse  Risk Reduction Factors:   Sense of responsibility to family  Continued Clinical Symptoms:  Depression:   Comorbid alcohol abuse/dependence  Cognitive Features That Contribute To Risk:  Closed-mindedness    Suicide Risk:  Minimal: No identifiable suicidal ideation.  Patients presenting with no risk factors but with morbid ruminations; may be classified as minimal risk based on the severity of the depressive symptoms   Plan Of Care/Follow-up recommendations:  Activity:  as tolerated Diet:  Heart Healthy  Laveda Abbe, NP 06/20/2018, 10:28 AM

## 2018-06-20 NOTE — ED Notes (Signed)
Pt reported he cam to the emergency room because he had been feeling depressed lately.

## 2018-06-20 NOTE — ED Notes (Signed)
Pt alert and oriented. Pt denies any pain or discomfort at this time. Pt denies any si, hi and avh at this time. Pt cooperative and calm. Pt resting in bed. Will continue to monitor.

## 2018-06-20 NOTE — Discharge Instructions (Signed)
For your behavioral health needs you are advised to follow up with [facility].  New patients are seen at their walk-in clinic.  Walk-in hours are Monday - Friday from 8:00 am - 12:00 pm, and from 1:00 pm - 3:00 pm.  Walk-in patients are seen on a first come, first served basis, so try to arrive as early as possible for the best chance of being seen the same day.  There is an initial fee of $22.50: ° °     [facility] °     [address] °     [place], [place] [address] °     [phone] °
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
## 2018-06-20 NOTE — ED Notes (Signed)
Patient transported to X-ray 

## 2018-06-20 NOTE — BH Assessment (Addendum)
[facility] Assessment Progress Note  Per Juanetta Beets, DO, this pt does not require psychiatric hospitalization at this time.  Pt is to be discharged from [facility] with recommendation to follow up with [facility].  This has been included in pt's discharge instructions.  Pt's nurse, Joanie Coddington, has been notified.  Doylene Canning, MA Triage Specialist [phone]

## 2018-06-20 NOTE — ED Notes (Signed)
Two patient belonging bags and one backpack taken from patient and stored behind nurse's station.
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
## 2018-06-25 ENCOUNTER — Other Ambulatory Visit: Payer: Self-pay

## 2018-06-25 DIAGNOSIS — R110 Nausea: Secondary | ICD-10-CM | POA: Insufficient documentation

## 2018-06-25 DIAGNOSIS — J029 Acute pharyngitis, unspecified: Secondary | ICD-10-CM | POA: Insufficient documentation

## 2018-06-25 DIAGNOSIS — R0981 Nasal congestion: Secondary | ICD-10-CM | POA: Insufficient documentation

## 2018-06-25 DIAGNOSIS — J45909 Unspecified asthma, uncomplicated: Secondary | ICD-10-CM | POA: Insufficient documentation

## 2018-06-25 DIAGNOSIS — R05 Cough: Secondary | ICD-10-CM | POA: Insufficient documentation

## 2018-06-25 DIAGNOSIS — F17210 Nicotine dependence, cigarettes, uncomplicated: Secondary | ICD-10-CM | POA: Insufficient documentation

## 2018-06-25 NOTE — ED Triage Notes (Signed)
Patient brought by [facility]. Patient complaining of nasal congestion and sore throat. Patient states it has been going on for a day. Patient has no other symptoms.

## 2018-06-26 ENCOUNTER — Emergency Department (HOSPITAL_COMMUNITY)
Admission: EM | Admit: 2018-06-26 | Discharge: 2018-06-26 | Disposition: A | Discharge status: Home / Self Care | Payer: Medicaid Other | Attending: Emergency Medicine | Admitting: Emergency Medicine

## 2018-06-26 ENCOUNTER — Other Ambulatory Visit: Payer: Self-pay

## 2018-06-26 ENCOUNTER — Encounter (HOSPITAL_COMMUNITY): Payer: Self-pay | Admitting: Emergency Medicine

## 2018-06-26 DIAGNOSIS — J069 Acute upper respiratory infection, unspecified: Secondary | ICD-10-CM

## 2018-06-26 MED ORDER — ONDANSETRON 4 MG PO TBDP: 4.0000 mg | ORAL_TABLET | Freq: Four times a day (QID) | ORAL | 0 refills | Status: AC | PRN

## 2018-06-26 MED ORDER — ACETAMINOPHEN 325 MG PO TABS: 650.0000 mg | ORAL_TABLET | Freq: Once | ORAL | Status: DC | PRN

## 2018-06-26 MED ORDER — IBUPROFEN 800 MG PO TABS
800.0000 mg | ORAL_TABLET | Freq: Once | ORAL | Status: AC
  Administered 2018-06-26: 800 mg via ORAL
  Filled 2018-06-26: qty 1

## 2018-06-26 MED ORDER — ONDANSETRON 4 MG PO TBDP
4.0000 mg | ORAL_TABLET | Freq: Once | ORAL | Status: AC
  Administered 2018-06-26: 4 mg via ORAL
  Filled 2018-06-26: qty 1

## 2018-06-26 NOTE — ED Provider Notes (Signed)
TIME SEEN: 2:57 AM  CHIEF COMPLAINT: Nasal congestion, dry cough, sore throat  HPI: Patient is a 28 year old male with history of asthma and seasonal allergies who presents to the emergency department with several days of nasal congestion, sore throat and dry cough.  Had some nausea today as well but no vomiting or diarrhea.  No fevers, chills, body aches.  No sick contacts or recent travel.  Did not try any medications prior to arrival.  ROS: See HPI Constitutional: no fever  Eyes: no drainage  ENT:  runny nose   Cardiovascular:  no chest pain  Resp: no SOB  GI: no vomiting GU: no dysuria Integumentary: no rash  Allergy: no hives  Musculoskeletal: no leg swelling  Neurological: no slurred speech ROS otherwise negative  PAST MEDICAL HISTORY/PAST SURGICAL HISTORY:  Past Medical History:  Diagnosis Date  . Asthma     MEDICATIONS:  Prior to Admission medications   Not on File    ALLERGIES:  No Known Allergies  SOCIAL HISTORY:  Social History   Tobacco Use  . Smoking status: Current Some Day Smoker  . Smokeless tobacco: Never Used  Substance Use Topics  . Alcohol use: Yes    Frequency: Never    FAMILY HISTORY: History reviewed. No pertinent family history.  EXAM: BP 124/83 (BP Location: Right Arm)   Pulse 82   Temp 98.4 F (36.9 C) (Oral)   Resp 17   Ht 5\' 5"  (1.651 m)   Wt 70.3 kg   SpO2 100%   BMI 25.79 kg/m  CONSTITUTIONAL: Alert and oriented and responds appropriately to questions. Well-appearing; well-nourished HEAD: Normocephalic EYES: Conjunctivae clear, pupils appear equal, EOMI ENT: normal nose; moist mucous membranes; No pharyngeal erythema or petechiae, no tonsillar hypertrophy or exudate, no uvular deviation, no unilateral swelling, no trismus or drooling, no muffled voice, normal phonation, no stridor, no dental caries present, no drainable dental abscess noted, no Ludwig's angina, tongue sits flat in the bottom of the mouth, no angioedema, no  facial erythema or warmth, no facial swelling; no pain with movement of the neck. NECK: Supple, no meningismus, no nuchal rigidity, no LAD  CARD: RRR; S1 and S2 appreciated; no murmurs, no clicks, no rubs, no gallops RESP: Normal chest excursion without splinting or tachypnea; breath sounds clear and equal bilaterally; no wheezes, no rhonchi, no rales, no hypoxia or respiratory distress, speaking full sentences ABD/GI: Normal bowel sounds; non-distended; soft, non-tender, no rebound, no guarding, no peritoneal signs, no hepatosplenomegaly BACK:  The back appears normal and is non-tender to palpation, there is no CVA tenderness EXT: Normal ROM in all joints; non-tender to palpation; no edema; normal capillary refill; no cyanosis, no calf tenderness or swelling    SKIN: Normal color for age and race; warm; no rash NEURO: Moves all extremities equally PSYCH: The patient's mood and manner are appropriate. Grooming and personal hygiene are appropriate.  MEDICAL DECISION MAKING: Patient here with symptoms of viral URI versus seasonal allergies.  Lungs are clear.  Doubt pneumonia.  No signs of strep pharyngitis, PTA, deep space neck infection.  Doubt meningitis.  He is extremely well-appearing here.  Recommended over-the-counter medications for supportive treatment.  Discussed return precautions.  I do not feel he needs antibiotics or flu testing from the emergency room.  Outside treatment window for Tamiflu and less likely influenza given patient has not had fevers.  Afebrile here and did not take any antipyretics prior to arrival.  At this time, I do not feel there is  any life-threatening condition present. I have reviewed and discussed all results (EKG, imaging, lab, urine as appropriate) and exam findings with patient/family. I have reviewed nursing notes and appropriate previous records.  I feel the patient is safe to be discharged home without further emergent workup and can continue workup as an  outpatient as needed. Discussed usual and customary return precautions. Patient/family verbalize understanding and are comfortable with this plan.  Outpatient follow-up has been provided as needed. All questions have been answered.      Ward, Layla Maw, DO 06/26/18 [phone]

## 2018-06-26 NOTE — Discharge Instructions (Addendum)
You may alternate Tylenol 1000 mg every 6 hours as needed for pain and Ibuprofen 800 mg every 8 hours as needed for pain.  Please take Ibuprofen with food.  You may use over-the-counter nasal saline and Afrin nasal spray for nasal congestion.  Please do not use Afrin for more than 3 days.  You may gargle with warm salt water and use Chloraseptic spray and throat lozenges for sore throat.  No sign of strep throat today.  You do not need antibiotics.  You may use over-the-counter Zyrtec and over-the-counter Flonase to help with symptoms of seasonal allergies.    Steps to find a Primary Care Provider (PCP):  Call [phone] or [phone] to access "[facility] Find a Doctor Service."  2.  You may also go on the [facility] website at [email or web address]  3.  [facility] also frequently accepts new patients.  [facility]  [address] [place] [address] [phone]  4.  There are also multiple [facility], [facility] and [facility] practices throughout the [place] that are frequently accepting new patients. You may find a clinic that is close to your home and contact them.  [facility] [email or web address] [phone]  [facility] [email or web address]  [email or web address] [phone]  [facility] [email or web address] [phone]  5.  Local Health Departments also can provide primary care services.  [facility]  [address] [place] [address] [phone]  [facility] Department [address] [place] [address] [phone]  [facility] [address] [place] [address]  [place] [address] [phone]

## 2018-06-26 NOTE — ED Notes (Signed)
Bed: WTR7 Expected date:  Expected time:  Means of arrival:  Comments: 

## 2018-06-27 ENCOUNTER — Encounter (HOSPITAL_COMMUNITY): Payer: Self-pay

## 2018-06-27 ENCOUNTER — Other Ambulatory Visit: Payer: Self-pay

## 2018-06-27 ENCOUNTER — Emergency Department (HOSPITAL_COMMUNITY)
Admission: EM | Admit: 2018-06-27 | Discharge: 2018-06-28 | Disposition: A | Discharge status: Home / Self Care | Payer: Self-pay | Attending: Emergency Medicine | Admitting: Emergency Medicine

## 2018-06-27 DIAGNOSIS — J069 Acute upper respiratory infection, unspecified: Secondary | ICD-10-CM | POA: Insufficient documentation

## 2018-06-27 DIAGNOSIS — R531 Weakness: Secondary | ICD-10-CM | POA: Insufficient documentation

## 2018-06-27 DIAGNOSIS — M7918 Myalgia, other site: Secondary | ICD-10-CM | POA: Insufficient documentation

## 2018-06-27 DIAGNOSIS — F17210 Nicotine dependence, cigarettes, uncomplicated: Secondary | ICD-10-CM | POA: Insufficient documentation

## 2018-06-27 DIAGNOSIS — B9789 Other viral agents as the cause of diseases classified elsewhere: Secondary | ICD-10-CM

## 2018-06-27 NOTE — ED Triage Notes (Signed)
Pt here for sore throat and cough for 4 days.  Nothing making it better or worse.  A&Ox4.

## 2018-06-28 ENCOUNTER — Emergency Department (HOSPITAL_COMMUNITY): Payer: Self-pay

## 2018-06-28 LAB — GROUP A STREP BY PCR: Group A Strep by PCR: NOT DETECTED

## 2018-06-28 NOTE — ED Provider Notes (Signed)
[facility] EMERGENCY DEPARTMENT Provider Note   CSN: [id number] Arrival date & time: 06/27/18  2253    History   Chief Complaint Chief Complaint  Patient presents with  . Sore Throat  . Cough    HPI David Lloyd is a 28 y.o. male with history of schizophrenia and asthma who presents to the emergency department with a chief complaint of sore throat with nonproductive cough, body aches, generalized weakness for the last 4 days.  He denies fever, chills, shortness of breath, ear pain, vomiting, diarrhea, abdominal pain.  No treatment prior to arrival.  He was seen for the same 2 days ago.      The history is provided by the patient. No language interpreter was used.    Past Medical History:  Diagnosis Date  . Asthma     Patient Active Problem List   Diagnosis Date Noted  . Schizophrenia (HCC) 09/25/2017    Past Surgical History:  Procedure Laterality Date  . APPENDECTOMY          Home Medications    Prior to Admission medications   Medication Sig Start Date End Date Taking? Authorizing Provider  ondansetron (ZOFRAN ODT) 4 MG disintegrating tablet Take 1 tablet (4 mg total) by mouth every 6 (six) hours as needed for nausea or vomiting. 06/26/18   Ward, Layla Maw, DO    Family History History reviewed. No pertinent family history.  Social History Social History   Tobacco Use  . Smoking status: Current Some Day Smoker  . Smokeless tobacco: Never Used  Substance Use Topics  . Alcohol use: Yes    Frequency: Never  . Drug use: No     Allergies   Patient has no known allergies.   Review of Systems Review of Systems  Constitutional: Negative for appetite change and fever.  HENT: Positive for sore throat. Negative for congestion, sinus pressure and sinus pain.   Eyes: Negative for photophobia and visual disturbance.  Respiratory: Positive for cough. Negative for shortness of breath.   Cardiovascular: Negative for chest pain.   Gastrointestinal: Negative for abdominal pain, diarrhea, nausea and vomiting.  Genitourinary: Negative for dysuria, frequency, penile pain, penile swelling and urgency.  Musculoskeletal: Positive for myalgias. Negative for back pain.  Skin: Negative for rash.  Allergic/Immunologic: Negative for immunocompromised state.  Neurological: Positive for weakness (generalized). Negative for dizziness, numbness and headaches.  Psychiatric/Behavioral: Negative for confusion.     Physical Exam Updated Vital Signs BP 126/76 (BP Location: Right Arm)   Pulse 79   Temp 98.8 F (37.1 C) (Oral)   Resp 16   SpO2 99%   Physical Exam Vitals signs and nursing note reviewed.  Constitutional:      General: He is not in acute distress.    Appearance: He is well-developed. He is not diaphoretic.  HENT:     Head: Normocephalic and atraumatic.     Right Ear: Tympanic membrane, ear canal and external ear normal.     Left Ear: Tympanic membrane, ear canal and external ear normal.     Nose: Mucosal edema and rhinorrhea present.     Right Sinus: No maxillary sinus tenderness or frontal sinus tenderness.     Left Sinus: No maxillary sinus tenderness or frontal sinus tenderness.     Mouth/Throat:     Mouth: Mucous membranes are not pale and not cyanotic.     Pharynx: Uvula midline. No oropharyngeal exudate or posterior oropharyngeal erythema.     Tonsils: No tonsillar  abscesses.  Eyes:     General: No scleral icterus.    Conjunctiva/sclera: Conjunctivae normal.     Pupils: Pupils are equal, round, and reactive to light.  Neck:     Musculoskeletal: Full passive range of motion without pain, normal range of motion and neck supple.  Cardiovascular:     Rate and Rhythm: Normal rate and regular rhythm.     Heart sounds: No murmur. No friction rub. No gallop.   Pulmonary:     Effort: Pulmonary effort is normal. No respiratory distress.     Breath sounds: Normal breath sounds. No stridor. No wheezing, rhonchi  or rales.  Chest:     Chest wall: No tenderness.  Abdominal:     General: There is no distension.     Palpations: Abdomen is soft. There is no mass.     Tenderness: There is no abdominal tenderness. There is no right CVA tenderness, left CVA tenderness, guarding or rebound.     Hernia: No hernia is present.  Musculoskeletal: Normal range of motion.  Lymphadenopathy:     Cervical: No cervical adenopathy.  Skin:    General: Skin is warm and dry.     Findings: No rash.  Neurological:     Mental Status: He is alert.  Psychiatric:        Behavior: Behavior normal.      ED Treatments / Results  Labs (all labs ordered are listed, but only abnormal results are displayed) Labs Reviewed  GROUP A STREP BY PCR    EKG None  Radiology Dg Chest 2 View  Result Date: 06/28/2018 CLINICAL DATA:  Cough and shortness of breath EXAM: CHEST - 2 VIEW COMPARISON:  01/19/2018 FINDINGS: Normal heart size and mediastinal contours. No acute infiltrate or edema. No effusion or pneumothorax. No acute osseous findings. IMPRESSION: Negative chest. Electronically Signed   By: Marnee Spring M.D.   On: 06/28/2018 06:22    Procedures Procedures (including critical care time)  Medications Ordered in ED Medications - No data to display   Initial Impression / Assessment and Plan / ED Course  I have reviewed the triage vital signs and the nursing notes.  Pertinent labs & imaging results that were available during my care of the patient were reviewed by me and considered in my medical decision making (see chart for details).  28 year old male presenting with sore throat, generalized body aches, generalized weakness, and nonproductive cough.  He was seen for the same 2 days ago.  Vital signs are reassuring.  He is afebrile and without tachycardia.  Normotensive.  No constitutional symptoms and no vomiting.  Clinical Course as of Jun 28 843  Fri Jun 28, 2018  0551 While attempting to examine the  patient's, I requested that the patient lean forward so that I could finish his HEENT exam.  He then started yelling " do not talk to me like that bra I am not a dog. I don't need all this."    [MM]    Clinical Course User Index [MM] ,  A, PA-C   He does have a history of asthma and with cough will order chest x-ray.  Chest x-ray is reassuring.  Strep PCR was ordered by triage staff and is negative.  At this time, I feel that no further urgent or emergent work-up is indicated.  I do not feel that he needs influenza testing.  He has no meningismus.  Low suspicion for deep neck infection, meningitis, pneumonia, Covid-19, or asthma exacerbation.  Suspect viral URI.  Supportive care recommended.  Recommended follow-up with PCP.  He is hemodynamically stable and in no acute distress.  Safe for discharge home with outpatient follow-up at this time.      Final Clinical Impressions(s) / ED Diagnoses   Final diagnoses:  Viral URI with cough    ED Discharge Orders    None       Barkley Boards, PA-C 06/28/18 0845    Palumbo, April, MD 06/30/18 2315

## 2018-06-28 NOTE — Discharge Instructions (Addendum)
Thank you for allowing me to care for you today in the Emergency Department.   You can take over-the-counter medications for your symptoms including cough.  Your chest x-ray was reassuring.  Continue to drink plenty of fluids to avoid dehydration.   Return to the emergency department if you pass out, develop persistent vomiting, develop a high fever that does not improve with Tylenol or ibuprofen, develop respiratory distress, or other new, concerning symptoms.

## 2018-06-29 ENCOUNTER — Emergency Department (HOSPITAL_COMMUNITY)
Admission: EM | Admit: 2018-06-29 | Discharge: 2018-06-29 | Disposition: A | Discharge status: Home / Self Care | Payer: Medicaid Other | Attending: Emergency Medicine | Admitting: Emergency Medicine

## 2018-06-29 ENCOUNTER — Other Ambulatory Visit: Payer: Self-pay

## 2018-06-29 DIAGNOSIS — R05 Cough: Secondary | ICD-10-CM | POA: Insufficient documentation

## 2018-06-29 DIAGNOSIS — F209 Schizophrenia, unspecified: Secondary | ICD-10-CM | POA: Insufficient documentation

## 2018-06-29 DIAGNOSIS — R059 Cough, unspecified: Secondary | ICD-10-CM

## 2018-06-29 DIAGNOSIS — F17210 Nicotine dependence, cigarettes, uncomplicated: Secondary | ICD-10-CM | POA: Insufficient documentation

## 2018-06-29 NOTE — Discharge Instructions (Addendum)
Get another place to stay if possible to avoid mole or other things you may be allergic to in the hotel room.

## 2018-06-29 NOTE — ED Provider Notes (Signed)
[facility] EMERGENCY DEPARTMENT Provider Note   CSN: [id number] Arrival date & time: 06/29/18  1957    History   Chief Complaint Chief Complaint  Patient presents with  . Cough    HPI David Lloyd is a 28 y.o. male who presents to the ED with cough and congestion that started a week ago. Patient has been evaluated for same symptoms on 2 occasions negative strep screen and normal CXR. Patient states he has the symptoms when he is in his motel room due to mold  in the room. He states he has ask the manager to change him to another room but has been denied. Patient not coughing in the ED but does when he is in his room at the motel.   HPI  Past Medical History:  Diagnosis Date  . Asthma     Patient Active Problem List   Diagnosis Date Noted  . Schizophrenia (HCC) 09/25/2017    Past Surgical History:  Procedure Laterality Date  . APPENDECTOMY          Home Medications    Prior to Admission medications   Medication Sig Start Date End Date Taking? Authorizing Provider  ondansetron (ZOFRAN ODT) 4 MG disintegrating tablet Take 1 tablet (4 mg total) by mouth every 6 (six) hours as needed for nausea or vomiting. 06/26/18   Ward, Layla Maw, DO    Family History No family history on file.  Social History Social History   Tobacco Use  . Smoking status: Current Some Day Smoker  . Smokeless tobacco: Never Used  Substance Use Topics  . Alcohol use: Yes    Frequency: Never  . Drug use: No     Allergies   Patient has no known allergies.   Review of Systems Review of Systems  HENT: Positive for congestion, rhinorrhea and sore throat.   Respiratory: Positive for cough. Negative for shortness of breath and wheezing.   All other systems reviewed and are negative.    Physical Exam Updated Vital Signs BP 122/72 (BP Location: Right Arm)   Pulse 99   Temp 98.2 F (36.8 C) (Oral)   Resp 16   SpO2 99%   Physical Exam Vitals signs and  nursing note reviewed.  Constitutional:      General: He is not in acute distress.    Appearance: He is well-developed.  HENT:     Head: Normocephalic and atraumatic.     Right Ear: Tympanic membrane normal.     Left Ear: Tympanic membrane normal.     Nose: Congestion present.     Mouth/Throat:     Mouth: Mucous membranes are moist.     Pharynx: No posterior oropharyngeal erythema.  Eyes:     Extraocular Movements: Extraocular movements intact.     Conjunctiva/sclera: Conjunctivae normal.  Neck:     Musculoskeletal: Neck supple.  Cardiovascular:     Rate and Rhythm: Normal rate and regular rhythm.  Pulmonary:     Effort: Pulmonary effort is normal.     Breath sounds: Normal breath sounds.  Abdominal:     Palpations: Abdomen is soft.     Tenderness: There is no abdominal tenderness.  Musculoskeletal: Normal range of motion.  Lymphadenopathy:     Cervical: No cervical adenopathy.  Skin:    General: Skin is warm and dry.  Neurological:     Mental Status: He is alert and oriented to person, place, and time.     Cranial Nerves: No cranial nerve deficit.  Psychiatric:        Mood and Affect: Mood normal.      ED Treatments / Results  Labs (all labs ordered are listed, but only abnormal results are displayed) Labs Reviewed - No data to display  Radiology Dg Chest 2 View  Result Date: 06/28/2018 CLINICAL DATA:  Cough and shortness of breath EXAM: CHEST - 2 VIEW COMPARISON:  01/19/2018 FINDINGS: Normal heart size and mediastinal contours. No acute infiltrate or edema. No effusion or pneumothorax. No acute osseous findings. IMPRESSION: Negative chest. Electronically Signed   By: Marnee Spring M.D.   On: 06/28/2018 06:22    Procedures Procedures (including critical care time)  Medications Ordered in ED Medications - No data to display   Initial Impression / Assessment and Plan / ED Course  I have reviewed the triage vital signs and the nursing notes. 28 y.o. male  with c/o cough stable for d/c without fever and does not appear toxic. Discussed with the patient need to leave his current room. Patient reports he is supposed to check out tomorrow anyway and that he has a friend that he can stay with tonight. Discussed with the patient that on previous visits his CXR was normal and his strep screen was negative. Patient will return as needed for problems.   Final Clinical Impressions(s) / ED Diagnoses   Final diagnoses:  Cough    ED Discharge Orders    None       Kerrie Buffalo [place], [place] 06/29/18 2137    Little, Ambrose Finland, MD 06/29/18 2355

## 2018-06-29 NOTE — ED Triage Notes (Signed)
Pt reports productive cough with nasal congestion x6 days. Pt states he has been seen for the same recently and is concerned this is due to "mold" is his apartment.

## 2019-04-07 ENCOUNTER — Other Ambulatory Visit: Payer: Self-pay

## 2019-04-07 ENCOUNTER — Encounter (HOSPITAL_COMMUNITY): Payer: Self-pay | Admitting: Emergency Medicine

## 2019-04-07 ENCOUNTER — Emergency Department (HOSPITAL_COMMUNITY)
Admission: EM | Admit: 2019-04-07 | Discharge: 2019-04-07 | Disposition: A | Discharge status: Home / Self Care | Payer: Medicaid Other | Attending: Emergency Medicine | Admitting: Emergency Medicine

## 2019-04-07 DIAGNOSIS — Z79899 Other long term (current) drug therapy: Secondary | ICD-10-CM | POA: Insufficient documentation

## 2019-04-07 DIAGNOSIS — N12 Tubulo-interstitial nephritis, not specified as acute or chronic: Secondary | ICD-10-CM | POA: Insufficient documentation

## 2019-04-07 DIAGNOSIS — J45909 Unspecified asthma, uncomplicated: Secondary | ICD-10-CM | POA: Insufficient documentation

## 2019-04-07 DIAGNOSIS — F17210 Nicotine dependence, cigarettes, uncomplicated: Secondary | ICD-10-CM | POA: Insufficient documentation

## 2019-04-07 DIAGNOSIS — R109 Unspecified abdominal pain: Secondary | ICD-10-CM

## 2019-04-07 LAB — URINALYSIS, ROUTINE W REFLEX MICROSCOPIC
Bacteria, UA: NONE SEEN
Bilirubin Urine: NEGATIVE
Glucose, UA: NEGATIVE mg/dL
Ketones, ur: NEGATIVE mg/dL
Leukocytes,Ua: NEGATIVE
Nitrite: NEGATIVE
Protein, ur: NEGATIVE mg/dL
Specific Gravity, Urine: 1.003 — ABNORMAL LOW (ref 1.005–1.030)
pH: 7 (ref 5.0–8.0)

## 2019-04-07 MED ORDER — SULFAMETHOXAZOLE-TRIMETHOPRIM 800-160 MG PO TABS: 1.0000 | ORAL_TABLET | Freq: Two times a day (BID) | ORAL | 0 refills | Status: AC

## 2019-04-07 NOTE — ED Provider Notes (Addendum)
[facility] EMERGENCY DEPARTMENT Provider Note   CSN: [id number] Arrival date & time: 04/07/19  [place]     History Chief Complaint  Patient presents with  . Flank Pain    David Lloyd is a 28 y.o. male.  29 y.o male with a PMH of Asthma presents to the ED with a chief complaint of left flank pain x 5 days. Patient was recently seen at [facility] for the same complaint, reports he was discharged home but shortly after being home for 2 days reports his symptoms began to return.  Today he reports a left-sided sharp pain that comes and goes, has not taken any occasion to help with his symptoms.  He also reports urinary symptoms such as frequency, urgency, reports he feels that he is not fully voiding.  He denies any dysuria.  He reports he has had no fever, nausea, vomiting, penile discharge.  Of note, patient is sexually active, his last encounter was approximately 6 days ago.  The history is provided by the patient.  Flank Pain       Past Medical History:  Diagnosis Date  . Asthma     Patient Active Problem List   Diagnosis Date Noted  . Schizophrenia ([place]) 09/25/2017    Past Surgical History:  Procedure Laterality Date  . APPENDECTOMY         No family history on file.  Social History   Tobacco Use  . Smoking status: Current Some Day Smoker  . Smokeless tobacco: Never Used  Substance Use Topics  . Alcohol use: Yes  . Drug use: No    Home Medications Prior to Admission medications   Medication Sig Start Date End Date Taking? Authorizing Provider  ondansetron (ZOFRAN ODT) 4 MG disintegrating tablet Take 1 tablet (4 mg total) by mouth every 6 (six) hours as needed for nausea or vomiting. 06/26/18   Ward, Delice Bison, DO  sulfamethoxazole-trimethoprim (BACTRIM DS) 800-160 MG tablet Take 1 tablet by mouth 2 (two) times daily for 14 days. 04/07/19 04/21/19  Janeece Fitting, PA-C    Allergies    Patient has no known  allergies.  Review of Systems   Review of Systems  Constitutional: Negative for fever.  Genitourinary: Positive for flank pain.    Physical Exam Updated Vital Signs BP 130/81 (BP Location: Left Arm)   Pulse 84   Temp 98.1 F (36.7 C) (Oral)   Resp 18   Ht 5\' 5"  (1.651 m)   Wt 72.6 kg   SpO2 99%   BMI 26.63 kg/m   Physical Exam Vitals and nursing note reviewed.  Constitutional:      Appearance: He is well-developed.  HENT:     Head: Normocephalic and atraumatic.  Eyes:     General: No scleral icterus.    Pupils: Pupils are equal, round, and reactive to light.  Cardiovascular:     Heart sounds: Normal heart sounds.  Pulmonary:     Effort: Pulmonary effort is normal.     Breath sounds: Normal breath sounds. No wheezing.    Chest:     Chest wall: No tenderness.  Abdominal:     General: Bowel sounds are normal. There is no distension.     Palpations: Abdomen is soft.     Tenderness: There is no abdominal tenderness. There is no right CVA tenderness or left CVA tenderness.     Comments: No CVA tenderness, abdomen is soft, bowel sounds present and normal.  Musculoskeletal:  General: No tenderness or deformity.     Cervical back: Normal range of motion.  Skin:    General: Skin is warm and dry.  Neurological:     Mental Status: He is alert and oriented to person, place, and time.     ED Results / Procedures / Treatments   Labs (all labs ordered are listed, but only abnormal results are displayed) Labs Reviewed  URINALYSIS, ROUTINE W REFLEX MICROSCOPIC - Abnormal; Notable for the following components:      Result Value   Color, Urine STRAW (*)    Specific Gravity, Urine 1.003 (*)    Hgb urine dipstick SMALL (*)    All other components within normal limits    EKG None  Radiology No results found.  Procedures Procedures (including critical care time)  Medications Ordered in ED Medications - No data to display  ED Course  I have reviewed the  triage vital signs and the nursing notes.  Pertinent labs & imaging results that were available during my care of the patient were reviewed by me and considered in my medical decision making (see chart for details).    MDM Rules/Calculators/A&P   Patient with a past medical history presents to the ED with complaints of left flank pain, this began approximately 5 days ago, was seen at [facility] for the same symptoms, he reports her symptoms got better however they worsened a couple of days ago.  He does report some urinary symptoms such as frequency, urgency, feels that he is not fully voiding.  No gross blood on his specimen, denies any penile discharge, currently sexually active however does not report any concerns for sexually transmitted infection.  No testicular pain, fevers, vomiting.  UA with small amount of hemoglobin, no nitrites, 0-5 white blood cell count.  Vitals are within normal limits, does have a CT from [facility] which only showed wall thickening of the bladder, no stones, obstruction, acute process.  Patient was not sent home with antibiotics as his last ED visit.  We will now place him on a short course of Bactrim to help treat his likely pyelonephritis, he is overall well-appearing, has not had any nausea or vomiting, reports he has been afebrile.  He denies any abdominal pain, has not had any tenderness along McBurney's point, no constipation, last bowel movements yesterday normal.  Patient is aware he will need to complete the course along with provide another urine specimen for his PCP.  Patient understands and agrees with management, otherwise in stable condition stable for discharge.   Portions of this note were generated with Scientist, clinical (histocompatibility and immunogenetics). Dictation errors may occur despite best attempts at proofreading.  Final Clinical Impression(s) / ED Diagnoses Final diagnoses:  Left flank pain  Pyelonephritis    Rx / DC Orders ED  Discharge Orders         Ordered    sulfamethoxazole-trimethoprim (BACTRIM DS) 800-160 MG tablet  2 times daily     04/07/19 1740           Claude Manges, PA-C 04/07/19 1753    Claude Manges, PA-C 04/07/19 1753    Sabas Sous, MD 04/08/19 [phone]

## 2019-04-07 NOTE — Discharge Instructions (Addendum)
I have prescribed antibiotics to help treat your infection, please take 1 tablet twice a day for the next 14 days.  If you experience any fever, nausea, vomiting or worsening symptoms please return to the emergency department.

## 2019-04-07 NOTE — ED Triage Notes (Signed)
Pt reports increased urinary frequency for a week and pain. Endorses left sided flank pain.

## 2019-08-17 IMAGING — DX CHEST - 2 VIEW
2 series · 2 of 2 positions shown · non-contrast
Comparison: 01/19/2018

CLINICAL DATA: Cough and shortness of breath

EXAM:
CHEST - 2 VIEW

[chest pa]
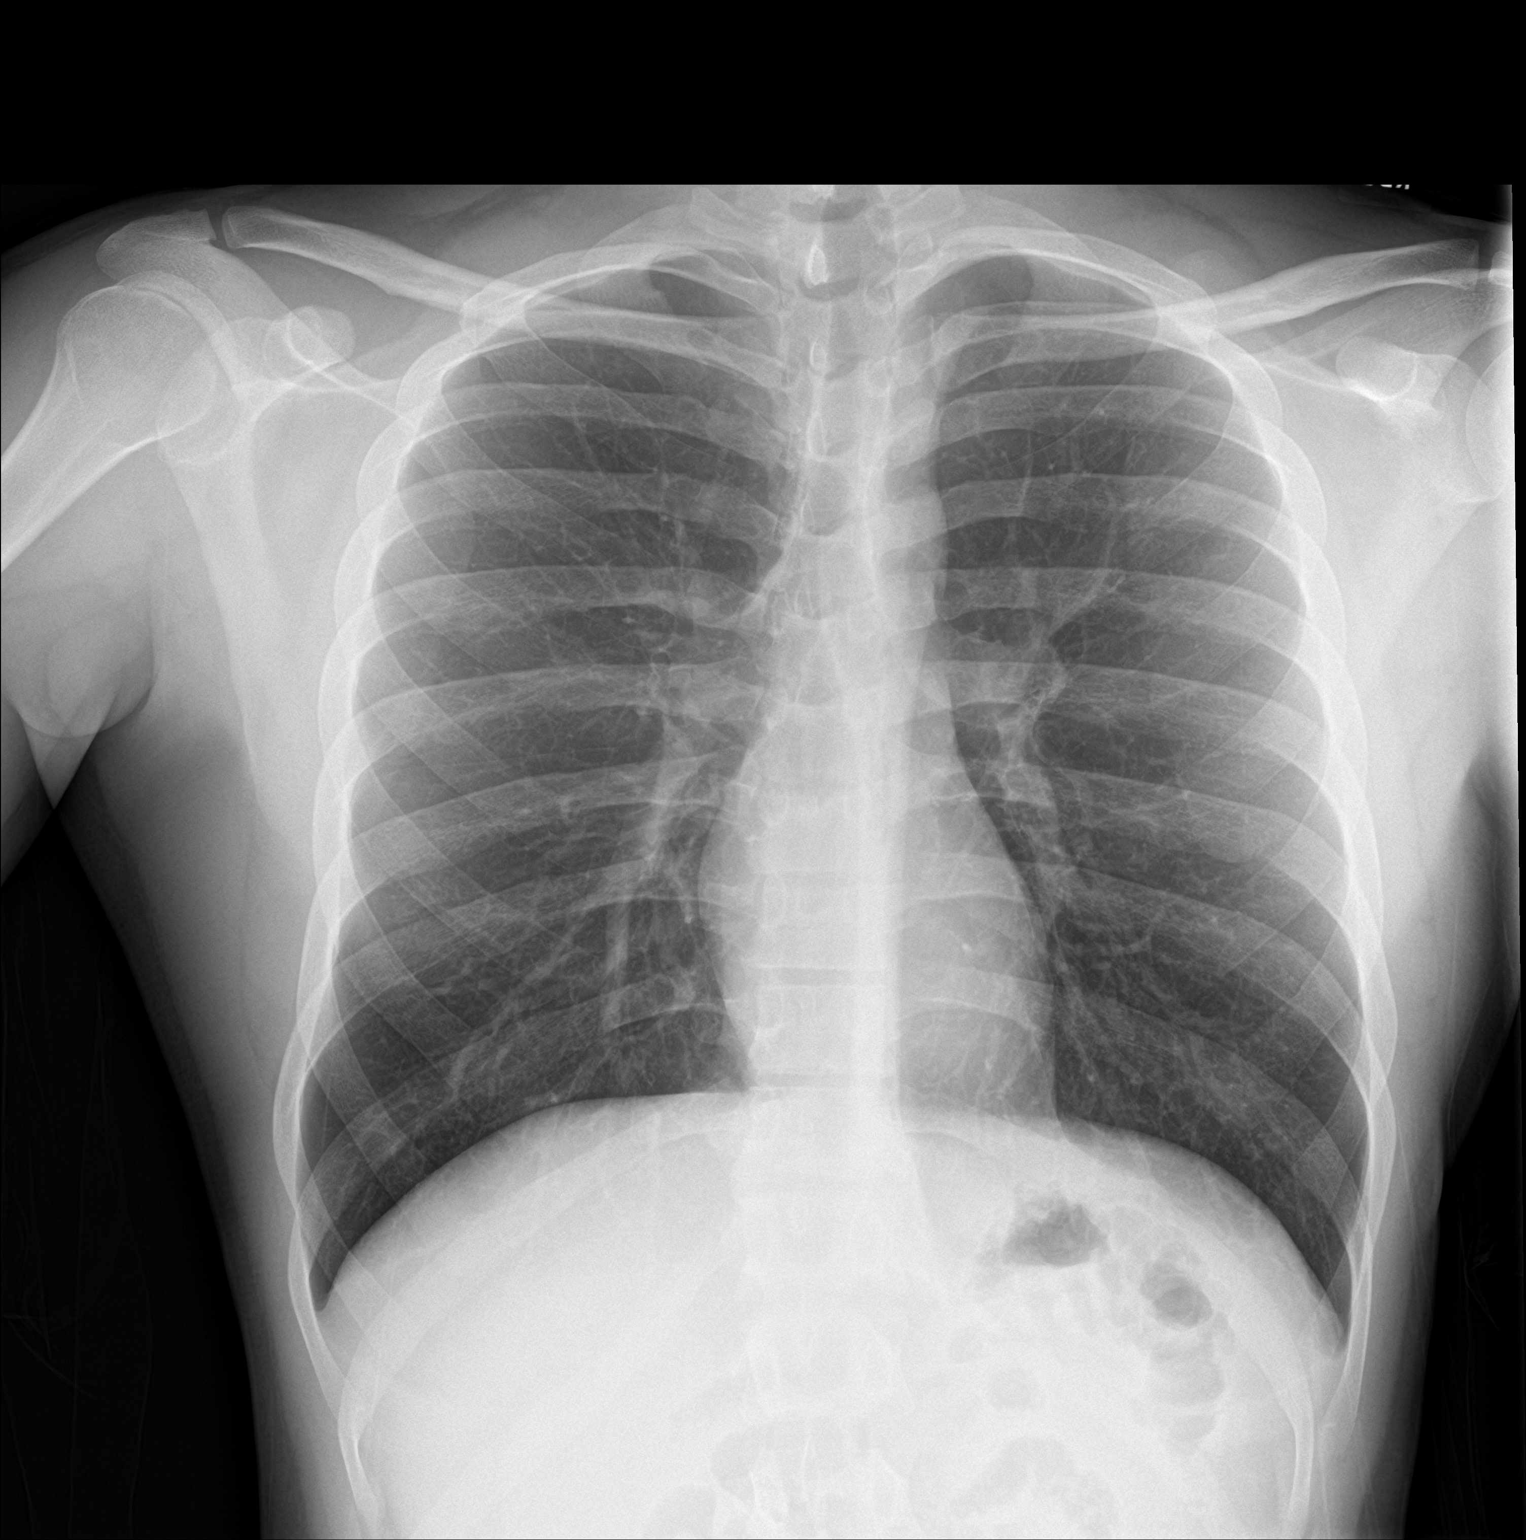

[chest lat]
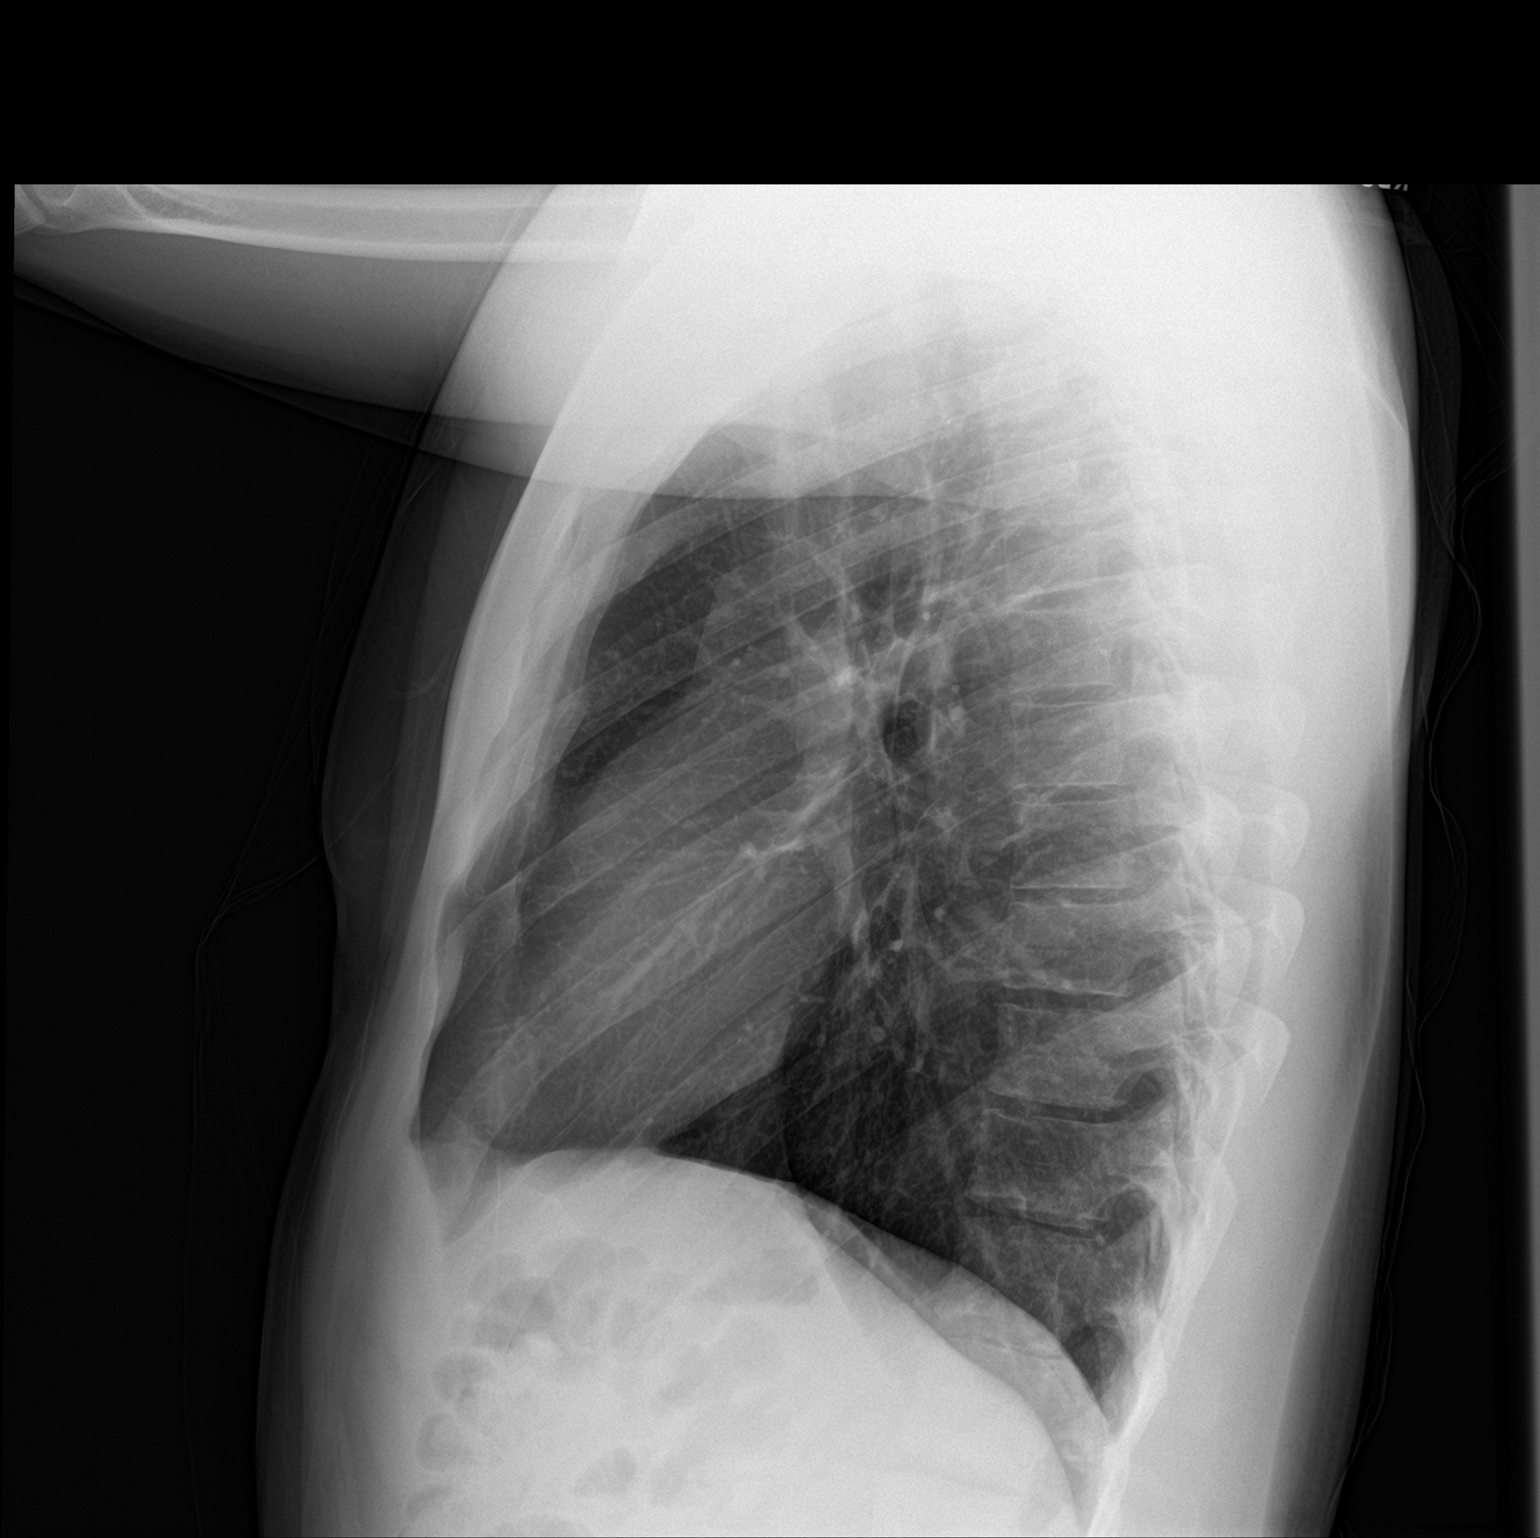

[2 of 2 positions shown; findings below may reference images not displayed]

FINDINGS: Normal heart size and mediastinal contours. No acute infiltrate or
edema. No effusion or pneumothorax. No acute osseous findings.
IMPRESSION: Negative chest.

## 2019-09-12 ENCOUNTER — Encounter (HOSPITAL_COMMUNITY): Payer: Self-pay | Admitting: Emergency Medicine

## 2019-09-12 ENCOUNTER — Emergency Department (HOSPITAL_COMMUNITY)
Admission: EM | Admit: 2019-09-12 | Discharge: 2019-09-12 | Disposition: A | Discharge status: Home / Self Care | Payer: Self-pay | Attending: Emergency Medicine | Admitting: Emergency Medicine

## 2019-09-12 ENCOUNTER — Other Ambulatory Visit: Payer: Self-pay

## 2019-09-12 DIAGNOSIS — Z5321 Procedure and treatment not carried out due to patient leaving prior to being seen by health care provider: Secondary | ICD-10-CM | POA: Insufficient documentation

## 2019-09-12 DIAGNOSIS — R05 Cough: Secondary | ICD-10-CM | POA: Insufficient documentation

## 2019-09-12 DIAGNOSIS — J029 Acute pharyngitis, unspecified: Secondary | ICD-10-CM | POA: Insufficient documentation

## 2019-09-12 DIAGNOSIS — R0981 Nasal congestion: Secondary | ICD-10-CM | POA: Insufficient documentation

## 2019-09-12 NOTE — ED Notes (Signed)
Called for room multiple times, no answer

## 2019-09-12 NOTE — ED Triage Notes (Signed)
Pt c/o cough, nasal congestion and sore throat. Denies known covid contacts.

## 2019-09-13 ENCOUNTER — Other Ambulatory Visit: Payer: Self-pay

## 2019-09-13 ENCOUNTER — Encounter (HOSPITAL_COMMUNITY): Payer: Self-pay

## 2019-09-13 ENCOUNTER — Emergency Department (HOSPITAL_COMMUNITY): Payer: Self-pay

## 2019-09-13 ENCOUNTER — Emergency Department (HOSPITAL_COMMUNITY)
Admission: EM | Admit: 2019-09-13 | Discharge: 2019-09-13 | Disposition: A | Discharge status: Home / Self Care | Payer: Self-pay | Attending: Emergency Medicine | Admitting: Emergency Medicine

## 2019-09-13 DIAGNOSIS — F17210 Nicotine dependence, cigarettes, uncomplicated: Secondary | ICD-10-CM | POA: Insufficient documentation

## 2019-09-13 DIAGNOSIS — Z20822 Contact with and (suspected) exposure to covid-19: Secondary | ICD-10-CM | POA: Insufficient documentation

## 2019-09-13 DIAGNOSIS — R05 Cough: Secondary | ICD-10-CM | POA: Insufficient documentation

## 2019-09-13 DIAGNOSIS — R059 Cough, unspecified: Secondary | ICD-10-CM

## 2019-09-13 LAB — SARS CORONAVIRUS 2 BY RT PCR (HOSPITAL ORDER, PERFORMED IN ~~LOC~~ HOSPITAL LAB): SARS Coronavirus 2: NEGATIVE

## 2019-09-13 LAB — POC SARS CORONAVIRUS 2 AG -  ED: SARS Coronavirus 2 Ag: NEGATIVE

## 2019-09-13 MED ORDER — BENZONATATE 100 MG PO CAPS: 100.0000 mg | ORAL_CAPSULE | Freq: Three times a day (TID) | ORAL | 0 refills | Status: AC | PRN

## 2019-09-13 MED ORDER — ALBUTEROL SULFATE HFA 108 (90 BASE) MCG/ACT IN AERS
2.0000 | INHALATION_SPRAY | Freq: Once | RESPIRATORY_TRACT | Status: AC
  Administered 2019-09-13: 2 via RESPIRATORY_TRACT
  Filled 2019-09-13: qty 6.7

## 2019-09-13 NOTE — ED Provider Notes (Signed)
[facility] EMERGENCY DEPARTMENT Provider Note   CSN: [id number] Arrival date & time: 09/13/19  0142     History Chief Complaint  Patient presents with  . Cough    David Lloyd is a 29 y.o. male.  The history is provided by the patient and medical records. No language interpreter was used.  Cough  David Lloyd is a 29 y.o. male who presents to the Emergency Department complaining of cough. He presents the emergency department complaining of cough and shortness of breath for the last 2 to 3 days. Cough is productive of mucus. He has associated body aches, loss of taste and smell. No reports of fevers, nausea, vomiting, leg swelling. He does have a history of asthma but does not take any medications. He smokes tobacco and he does drink alcohol. He works in [employer]. He lives alone. No known sick contacts. He states he was exposed to somebody that had a coronavirus vaccine.    Past Medical History:  Diagnosis Date  . Asthma     Patient Active Problem List   Diagnosis Date Noted  . Schizophrenia (HCC) 09/25/2017    Past Surgical History:  Procedure Laterality Date  . APPENDECTOMY         No family history on file.  Social History   Tobacco Use  . Smoking status: Current Some Day Smoker  . Smokeless tobacco: Never Used  Substance Use Topics  . Alcohol use: Yes  . Drug use: No    Home Medications Prior to Admission medications   Medication Sig Start Date End Date Taking? Authorizing Provider  benzonatate (TESSALON) 100 MG capsule Take 1 capsule (100 mg total) by mouth 3 (three) times daily as needed for cough. 09/13/19   Tilden Fossa, MD  ondansetron (ZOFRAN ODT) 4 MG disintegrating tablet Take 1 tablet (4 mg total) by mouth every 6 (six) hours as needed for nausea or vomiting. 06/26/18   Ward, Layla Maw, DO    Allergies    Patient has no known allergies.  Review of Systems   Review of Systems  Respiratory: Positive for cough.     All other systems reviewed and are negative.   Physical Exam Updated Vital Signs BP 130/83 (BP Location: Left Arm)   Pulse 80   Temp 98.5 F (36.9 C) (Oral)   Resp 17   Ht 5\' 5"  (1.651 m)   SpO2 99%   BMI 26.63 kg/m   Physical Exam Vitals and nursing note reviewed.  Constitutional:      Appearance: He is well-developed.  HENT:     Head: Normocephalic and atraumatic.  Cardiovascular:     Rate and Rhythm: Normal rate and regular rhythm.     Heart sounds: No murmur.  Pulmonary:     Effort: Pulmonary effort is normal. No respiratory distress.     Comments: Rhonchi in the bases bilaterally Abdominal:     Palpations: Abdomen is soft.     Tenderness: There is no abdominal tenderness. There is no guarding or rebound.  Musculoskeletal:        General: No swelling or tenderness.  Skin:    General: Skin is warm and dry.  Neurological:     Mental Status: He is alert and oriented to person, place, and time.  Psychiatric:        Behavior: Behavior normal.     ED Results / Procedures / Treatments   Labs (all labs ordered are listed, but only abnormal results are displayed) Labs Reviewed  SARS CORONAVIRUS 2 BY RT PCR (HOSPITAL ORDER, [facility])  POC SARS CORONAVIRUS 2 AG -  ED    EKG None  Radiology DG Chest Port 1 View  Result Date: 09/13/2019 CLINICAL DATA:  Cough for several days EXAM: PORTABLE CHEST 1 VIEW COMPARISON:  06/28/2018 FINDINGS: The heart size and mediastinal contours are within normal limits. Both lungs are clear. The visualized skeletal structures are unremarkable. IMPRESSION: No active disease. Electronically Signed   By: Inez Catalina M.D.   On: 09/13/2019 08:09    Procedures Procedures (including critical care time)  Medications Ordered in ED Medications  albuterol (VENTOLIN HFA) 108 (90 Base) MCG/ACT inhaler 2 puff (2 puffs Inhalation Given 09/13/19 0745)    ED Course  I have reviewed the triage vital signs and the  nursing notes.  Pertinent labs & imaging results that were available during my care of the patient were reviewed by me and considered in my medical decision making (see chart for details).    MDM Rules/Calculators/A&P                     Patient with history of asthma here for evaluation of increased cough and loss of taste and smell. He is non-toxic appearing on evaluation with no respiratory distress. He does have occasional wheezes and rhonchi on exam. He does have partial improvement in symptoms after albuterol use. Discussed with patient concern for possible COVID-19 infection. Discussed home isolation. Also discussed treatment at home for asthma with albuterol as needed. Discussed outpatient follow-up and return precautions.  David Lloyd was evaluated in Emergency Department on 09/13/2019 for the symptoms described in the history of present illness. He was evaluated in the context of the global COVID-19 pandemic, which necessitated consideration that the patient might be at risk for infection with the SARS-CoV-2 virus that causes COVID-19. Institutional protocols and algorithms that pertain to the evaluation of patients at risk for COVID-19 are in a state of rapid change based on information released by regulatory bodies including the CDC and federal and state organizations. These policies and algorithms were followed during the patient's care in the ED.  Final Clinical Impression(s) / ED Diagnoses Final diagnoses:  Cough  Suspected COVID-19 virus infection    Rx / DC Orders ED Discharge Orders         Ordered    benzonatate (TESSALON) 100 MG capsule  3 times daily PRN     09/13/19 0843           Quintella Reichert, MD 09/13/19 [phone]

## 2019-09-13 NOTE — ED Notes (Signed)
Patient verbalizes understanding of discharge instructions. Opportunity for questioning and answers were provided. Armband removed by staff, pt discharged from ED.  

## 2019-09-13 NOTE — Discharge Instructions (Addendum)
You can use the albuterol inhaler, two puffs every four hours as needed for cough/wheeze.    Get rechecked if you develop severe worsening in your breathing or new concerning symptoms.

## 2019-09-13 NOTE — ED Triage Notes (Signed)
Pt reports that for the past few days he has had cough, congestion, loss of smell, and sore throat.

## 2021-06-20 ENCOUNTER — Other Ambulatory Visit: Payer: Self-pay

## 2021-06-20 ENCOUNTER — Emergency Department (HOSPITAL_COMMUNITY)
Admission: EM | Admit: 2021-06-20 | Discharge: 2021-06-20 | Disposition: A | Discharge status: Home / Self Care | Payer: Self-pay | Attending: Student | Admitting: Student

## 2021-06-20 ENCOUNTER — Emergency Department (HOSPITAL_COMMUNITY): Payer: Self-pay

## 2021-06-20 ENCOUNTER — Encounter (HOSPITAL_COMMUNITY): Payer: Self-pay | Admitting: Emergency Medicine

## 2021-06-20 DIAGNOSIS — R079 Chest pain, unspecified: Secondary | ICD-10-CM

## 2021-06-20 DIAGNOSIS — F1410 Cocaine abuse, uncomplicated: Secondary | ICD-10-CM | POA: Insufficient documentation

## 2021-06-20 DIAGNOSIS — J45909 Unspecified asthma, uncomplicated: Secondary | ICD-10-CM | POA: Insufficient documentation

## 2021-06-20 DIAGNOSIS — D72829 Elevated white blood cell count, unspecified: Secondary | ICD-10-CM | POA: Insufficient documentation

## 2021-06-20 DIAGNOSIS — R0789 Other chest pain: Secondary | ICD-10-CM | POA: Insufficient documentation

## 2021-06-20 DIAGNOSIS — M79631 Pain in right forearm: Secondary | ICD-10-CM | POA: Insufficient documentation

## 2021-06-20 LAB — CBC
HCT: 45 % (ref 39.0–52.0)
Hemoglobin: 15.1 g/dL (ref 13.0–17.0)
MCH: 30.7 pg (ref 26.0–34.0)
MCHC: 33.6 g/dL (ref 30.0–36.0)
MCV: 91.5 fL (ref 80.0–100.0)
Platelets: 340 10*3/uL (ref 150–400)
RBC: 4.92 MIL/uL (ref 4.22–5.81)
RDW: 13.5 % (ref 11.5–15.5)
WBC: 15 10*3/uL — ABNORMAL HIGH (ref 4.0–10.5)
nRBC: 0 % (ref 0.0–0.2)

## 2021-06-20 LAB — COMPREHENSIVE METABOLIC PANEL
ALT: 24 U/L (ref 0–44)
AST: 28 U/L (ref 15–41)
Albumin: 4.7 g/dL (ref 3.5–5.0)
Alkaline Phosphatase: 85 U/L (ref 38–126)
Anion gap: 10 (ref 5–15)
BUN: 10 mg/dL (ref 6–20)
CO2: 28 mmol/L (ref 22–32)
Calcium: 10 mg/dL (ref 8.9–10.3)
Chloride: 97 mmol/L — ABNORMAL LOW (ref 98–111)
Creatinine, Ser: 0.96 mg/dL (ref 0.61–1.24)
GFR, Estimated: >60 mL/min (ref >60)
Glucose, Bld: 106 mg/dL — ABNORMAL HIGH (ref 70–99)
Potassium: 4.5 mmol/L (ref 3.5–5.1)
Sodium: 135 mmol/L (ref 135–145)
Total Bilirubin: 0.8 mg/dL (ref 0.3–1.2)
Total Protein: 8 g/dL (ref 6.5–8.1)

## 2021-06-20 LAB — TROPONIN I (HIGH SENSITIVITY)
Troponin I (High Sensitivity): 8 ng/L (ref <18)
Troponin I (High Sensitivity): 7 ng/L (ref <18)

## 2021-06-20 NOTE — ED Triage Notes (Addendum)
Patient arrived with EMS from street ( homeless) reports pain at right anterior forearm this evening " feels tight" , denies injury / no deformity . Patient stated that he smoked Cocaine this evening .  ?

## 2021-06-20 NOTE — ED Provider Notes (Signed)
?[facility] EMERGENCY DEPARTMENT ?Provider Note ? ? ?CSN: [id number] ?Arrival date & time: 06/20/21  0011 ? ?  ? ?History ? ?Chief Complaint  ?Patient presents with  ? Forearm Pain   ? ? ?David Lloyd is a 31 y.o. male with a history of asthma, schizophrenia, and drug use who presents to the emergency department with complaints of chest discomfort and right forearm discomfort.  Patient states that approximately 45 minutes prior to arrival he smoked some cocaine, he thinks this may have been laced with something else because he began to feel abnormally shortly after utilizing the drug.  He reports some chest tightness as well as some right forearm tightness.  No alleviating or aggravating factors.  No intervention prior to arrival.  He denies nausea, vomiting, dyspnea, hemoptysis, leg pain/swelling, numbness, or weakness.  States that he has had a little bit of alcohol tonight as well.  Denies any additional drug use.  Denies IV drug use. ? ?HPI ? ?  ? ?Home Medications ?Prior to Admission medications   ?Medication Sig Start Date End Date Taking? Authorizing Provider  ?benzonatate (TESSALON) 100 MG capsule Take 1 capsule (100 mg total) by mouth 3 (three) times daily as needed for cough. 09/13/19   Tilden Fossa, MD  ?ondansetron (ZOFRAN ODT) 4 MG disintegrating tablet Take 1 tablet (4 mg total) by mouth every 6 (six) hours as needed for nausea or vomiting. 06/26/18   Ward, Layla Maw, DO  ?   ? ?Allergies    ?Patient has no known allergies.   ? ?Review of Systems   ?Review of Systems  ?Constitutional:  Negative for chills and fever.  ?Respiratory:  Negative for cough and shortness of breath.   ?Cardiovascular:  Positive for chest pain.  ?Gastrointestinal:  Negative for abdominal pain and vomiting.  ?Musculoskeletal:  Positive for myalgias.  ?Neurological:  Negative for syncope, weakness and numbness.  ?All other systems reviewed and are negative. ? ?Physical Exam ?Updated Vital Signs ?BP 134/80 (BP  Location: Left Arm)   Pulse (!) 103   Temp 98.3 ?F (36.8 ?C) (Oral)   Resp 18   SpO2 97%  ?Physical Exam ?Vitals and nursing note reviewed.  ?Constitutional:   ?   General: He is not in acute distress. ?   Appearance: Normal appearance. He is well-developed. He is not ill-appearing or toxic-appearing.  ?HENT:  ?   Head: Normocephalic and atraumatic.  ?Eyes:  ?   General:     ?   Right eye: No discharge.     ?   Left eye: No discharge.  ?   Conjunctiva/sclera: Conjunctivae normal.  ?Neck:  ?   Comments: No midline tenderness.  ?Cardiovascular:  ?   Rate and Rhythm: Regular rhythm. Tachycardia present.  ?   Pulses:     ?     Radial pulses are 2+ on the right side and 2+ on the left side.  ?     Posterior tibial pulses are 2+ on the right side and 2+ on the left side.  ?Pulmonary:  ?   Effort: Pulmonary effort is normal. No respiratory distress.  ?   Breath sounds: Normal breath sounds. No wheezing, rhonchi or rales.  ?Abdominal:  ?   General: There is no distension.  ?   Palpations: Abdomen is soft.  ?   Tenderness: There is no abdominal tenderness. There is no guarding or rebound.  ?Musculoskeletal:  ?   Cervical back: Normal range of motion and neck  supple.  ?   Comments: Upper extremities: No obvious deformity, appreciable swelling, edema, erythema, ecchymosis, warmth, or open wounds. Patient has intact AROM throughout. Nontender. Compartments are soft.   ?Skin: ?   General: Skin is warm and dry.  ?   Capillary Refill: Capillary refill takes less than 2 seconds.  ?   Findings: No rash.  ?Neurological:  ?   Mental Status: He is alert.  ?   Comments: Alert. Clear speech. Sensation grossly intact to bilateral upper extremities. 5/5 symmetric grip strength and strength with elbow flexion/extension and shoulder flexion/extension. Ambulatory.   ?Psychiatric:     ?   Mood and Affect: Mood normal.     ?   Behavior: Behavior normal.  ? ? ?ED Results / Procedures / Treatments   ?Labs ?(all labs ordered are listed, but  only abnormal results are displayed) ?Labs Reviewed  ?COMPREHENSIVE METABOLIC PANEL - Abnormal; Notable for the following components:  ?    Result Value  ? Chloride 97 (*)   ? Glucose, Bld 106 (*)   ? All other components within normal limits  ?CBC - Abnormal; Notable for the following components:  ? WBC 15.0 (*)   ? All other components within normal limits  ?TROPONIN I (HIGH SENSITIVITY)  ?TROPONIN I (HIGH SENSITIVITY)  ? ? ?EKG ?EKG Interpretation ? ?Date/Time:  Monday June 20 2021 00:20:34 EST ?Ventricular Rate:  96 ?PR Interval:  136 ?QRS Duration: 82 ?QT Interval:  346 ?QTC Calculation: 437 ?R Axis:   83 ?Text Interpretation: Normal sinus rhythm Right atrial enlargement When compared with ECG of 19-Jan-2018 21:29, PREVIOUS ECG IS PRESENT Confirmed by Kommor, Madison ([id number]) on 06/20/2021 3:48:30 AM ? ?Radiology ?DG Chest 2 View ? ?Result Date: 06/20/2021 ?CLINICAL DATA:  Chest pain. EXAM: CHEST - 2 VIEW COMPARISON:  Sep 13, 2019 FINDINGS: The heart size and mediastinal contours are within normal limits. Both lungs are clear. The visualized skeletal structures are unremarkable. IMPRESSION: No active cardiopulmonary disease. Electronically Signed   By: Aram Candela M.D.   On: 06/20/2021 01:11   ? ?Procedures ?Procedures  ? ? ?Medications Ordered in ED ?Medications - No data to display ? ?ED Course/ Medical Decision Making/ A&P ?  ?                        ?Medical Decision Making ?Amount and/or Complexity of Data Reviewed ?Labs: ordered. ?Radiology: ordered. ? ? ?Patient presents to the ED with complaints of forearm and chest tightness S/p cocaine use shortly PTA, this involves an extensive number of treatment options, and is a complaint that carries with it a high risk of complications and morbidity. Nontoxic, vitals w/ mild tachycardia.  ? ?Additional history obtained:  ?Chart review/nursing note review.  ? ?EKG: No STEMI- Normal sinus rhythm Right atrial enlargement  ? ?Lab Tests:  ?I reviewed & interpreted  labs including:  ?CBC: Leukocytosis ?CMP: Fairly unremarkable, no critical electrolyte derangement. ?Troponins: No significant elevation, flat ? ?Imaging Studies ordered:  ?I ordered and viewed the following imaging, agree with radiologist impression: No active cardiopulmonary disease.  ? ?ED Course:  ?Heart Pathway Score low risk- EKG without obvious acute ischemia, delta troponin negative, doubt ACS. Patient is low risk wells, doubt pulmonary embolism. Pain is not a tearing sensation, patient is normotensive, symmetric pulses, no widening of mediastinum on CXR, doubt dissection.  Labs without critical electrolyte derangement and renal function is within normal limits.  In terms of patient's right forearm  tightness exam is unremarkable, there is no overlying signs of infection, no edema, no tenderness to palpation, he is neurovascular intact distally, and compartments are soft, overall clinical exam does not seem consistent with DVT, arterial occlusion, compartment syndrome, or rhabdomyolysis.  Patient is feeling much better following observation in the emergency department.  He overall seems reasonable for discharge at this time.  He was advised to avoid further cocaine use. I discussed results, treatment plan, need for follow-up, and return precautions with the patient. Provided opportunity for questions, patient confirmed understanding and is in agreement with plan.  ? ? ?Portions of this note were generated with Scientist, clinical (histocompatibility and immunogenetics). Dictation errors may occur despite best attempts at proofreading. ? ?Final Clinical Impression(s) / ED Diagnoses ?Final diagnoses:  ?Cocaine abuse (HCC)  ?Chest pain, unspecified type  ? ? ?Rx / DC Orders ?ED Discharge Orders   ? ? None  ? ?  ? ? ?  ?Cherly Anderson, PA-C ?06/20/21 0411 ? ?  ?Glendora Score, MD ?06/20/21 [phone] ? ?

## 2021-06-20 NOTE — Discharge Instructions (Addendum)
You were seen in the emergency department today for chest pain. Your work-up in the emergency department has been overall reassuring. Your labs have been fairly normal and or similar to previous blood work you have had done-right blood cell count was elevated, that should be rechecked by your primary care provider within 1 week.. Your EKG and the enzyme we use to check your heart did not show an acute heart attack at this time. Your chest x-ray was normal.  ? ?Please avoid cocaine use this is likely contributing to your symptoms tonight.  ? ?Follow-up with primary care within 1 week.  Return to the ER immediately should you experience any new or worsening symptoms including but not limited to return of pain, worsened pain, vomiting, shortness of breath, dizziness, lightheadedness, passing out, numbness, weakness, or any other concerns that you may have.  ? ?

## 2022-05-05 ENCOUNTER — Other Ambulatory Visit: Payer: Self-pay

## 2022-05-05 ENCOUNTER — Emergency Department (HOSPITAL_COMMUNITY)
Admission: EM | Admit: 2022-05-05 | Discharge: 2022-05-05 | Disposition: A | Discharge status: Home / Self Care | Payer: BLUE CROSS/BLUE SHIELD | Attending: Emergency Medicine | Admitting: Emergency Medicine

## 2022-05-05 ENCOUNTER — Encounter (HOSPITAL_COMMUNITY): Payer: Self-pay

## 2022-05-05 DIAGNOSIS — U071 COVID-19: Secondary | ICD-10-CM | POA: Diagnosis not present

## 2022-05-05 DIAGNOSIS — R519 Headache, unspecified: Secondary | ICD-10-CM | POA: Diagnosis present

## 2022-05-05 LAB — RESP PANEL BY RT-PCR (RSV, FLU A&B, COVID)  RVPGX2
Influenza A by PCR: NEGATIVE
Influenza B by PCR: NEGATIVE
Resp Syncytial Virus by PCR: NEGATIVE
SARS Coronavirus 2 by RT PCR: POSITIVE — AB

## 2022-05-05 LAB — GROUP A STREP BY PCR: Group A Strep by PCR: NOT DETECTED

## 2022-05-05 NOTE — Discharge Instructions (Signed)
Testing today positive for COVID.  Will go and give you a work note to be out of work for the next 5 days.  Recommend Motrin and extra strength Tylenol for the body aches headache and throat pain.  Also if you get more of cough and congestion you can use over-the-counter cold and flu medicines.  Return for any new or worse symptoms in particular return for any difficulty breathing.  Lung sounds and vital signs and oxygenation is excellent here today.

## 2022-05-05 NOTE — ED Triage Notes (Signed)
Pt arrived to triage complaining of flu like symptoms that started about 5 days ago. Pt states he has a sore throat, headache, chills and body aches.   Pt states he has been taking Advil

## 2022-05-05 NOTE — ED Notes (Signed)
Attempted calling the pt as he left his debit card in the room. Pt did not answer.

## 2022-05-05 NOTE — ED Provider Notes (Signed)
[facility] EMERGENCY DEPARTMENT Provider Note   CSN: [id number] Arrival date & time: 05/05/22  0175     History  Chief Complaint  Patient presents with   Headache   Generalized Body Aches    David Lloyd is a 32 y.o. male.  Patient with 5-day history of flulike illness.  Main complaints are sore throat headache chills body aches slight cough and congestion.  Past medical history noncontributory except for he does have a history of asthma.  Patient is not a user of tobacco products.  Patient does not feel as if he is wheezing today.       Home Medications Prior to Admission medications   Medication Sig Start Date End Date Taking? Authorizing Provider  benzonatate (TESSALON) 100 MG capsule Take 1 capsule (100 mg total) by mouth 3 (three) times daily as needed for cough. 09/13/19   Quintella Reichert, MD  ondansetron (ZOFRAN ODT) 4 MG disintegrating tablet Take 1 tablet (4 mg total) by mouth every 6 (six) hours as needed for nausea or vomiting. 06/26/18   Ward, Delice Bison, DO      Allergies    Patient has no known allergies.    Review of Systems   Review of Systems  Constitutional:  Positive for chills. Negative for fever.  HENT:  Positive for congestion and sore throat. Negative for rhinorrhea.   Eyes:  Negative for visual disturbance.  Respiratory:  Positive for cough. Negative for shortness of breath.   Cardiovascular:  Negative for chest pain and leg swelling.  Gastrointestinal:  Negative for abdominal pain, diarrhea, nausea and vomiting.  Genitourinary:  Negative for dysuria.  Musculoskeletal:  Positive for myalgias. Negative for back pain and neck pain.  Skin:  Negative for rash.  Neurological:  Positive for headaches. Negative for dizziness and light-headedness.  Hematological:  Does not bruise/bleed easily.  Psychiatric/Behavioral:  Negative for confusion.     Physical Exam Updated Vital Signs BP 114/71   Pulse 93   Temp 99.5 F (37.5  C)   Resp 18   Ht 1.626 m (5\' 4" )   Wt 59 kg   SpO2 90%   BMI 22.31 kg/m  Physical Exam Vitals and nursing note reviewed.  Constitutional:      General: He is not in acute distress.    Appearance: He is well-developed. He is not ill-appearing.  HENT:     Head: Normocephalic and atraumatic.     Mouth/Throat:     Mouth: Mucous membranes are moist.     Pharynx: Oropharynx is clear. No oropharyngeal exudate or posterior oropharyngeal erythema.     Comments: White coating on tongue.  Uvula midline no evidence of any peritonsillar abscess.  No erythema no exudate. Eyes:     Conjunctiva/sclera: Conjunctivae normal.  Cardiovascular:     Rate and Rhythm: Normal rate and regular rhythm.     Heart sounds: No murmur heard. Pulmonary:     Effort: Pulmonary effort is normal. No respiratory distress.     Breath sounds: Normal breath sounds. No stridor. No wheezing, rhonchi or rales.  Chest:     Chest wall: No tenderness.  Abdominal:     Palpations: Abdomen is soft.     Tenderness: There is no abdominal tenderness.  Musculoskeletal:        General: No swelling.     Cervical back: Neck supple.  Skin:    General: Skin is warm and dry.     Capillary Refill: Capillary refill takes less than  2 seconds.     Findings: No rash.  Neurological:     Mental Status: He is alert and oriented to person, place, and time.     Cranial Nerves: No cranial nerve deficit.     Sensory: No sensory deficit.  Psychiatric:        Mood and Affect: Mood normal.     ED Results / Procedures / Treatments   Labs (all labs ordered are listed, but only abnormal results are displayed) Labs Reviewed  RESP PANEL BY RT-PCR (RSV, FLU A&B, COVID)  [id number] - Abnormal; Notable for the following components:      Result Value   SARS Coronavirus 2 by RT PCR POSITIVE (*)    All other components within normal limits  GROUP A STREP BY PCR    EKG None  Radiology No results found.  Procedures Procedures     Medications Ordered in ED Medications - No data to display  ED Course/ Medical Decision Making/ A&P                             Medical Decision Making  Patient nontoxic no acute distress.  Oxygen saturation is 99% on room air.  Strep test negative.  Respiratory panel positive for COVID.  Explain symptoms.  Patient has a history of asthma but lungs are clear, no wheezing.  Will provide patient with work note symptomatic treatment.  Patient 5 days into illness.  So Paxlovid antivirals will not be very helpful.   Final Clinical Impression(s) / ED Diagnoses Final diagnoses:  COVID    Rx / DC Orders ED Discharge Orders     None         Fredia Sorrow, MD 05/05/22 [phone]

## 2022-07-04 ENCOUNTER — Emergency Department (HOSPITAL_COMMUNITY)
Admission: EM | Admit: 2022-07-04 | Discharge: 2022-07-05 | Disposition: A | Discharge status: Home / Self Care | Payer: Commercial Managed Care - HMO | Attending: Emergency Medicine | Admitting: Emergency Medicine

## 2022-07-04 ENCOUNTER — Encounter (HOSPITAL_COMMUNITY): Payer: Self-pay | Admitting: Emergency Medicine

## 2022-07-04 ENCOUNTER — Other Ambulatory Visit: Payer: Self-pay

## 2022-07-04 DIAGNOSIS — Z5900 Homelessness unspecified: Secondary | ICD-10-CM

## 2022-07-04 DIAGNOSIS — Z79899 Other long term (current) drug therapy: Secondary | ICD-10-CM | POA: Insufficient documentation

## 2022-07-04 DIAGNOSIS — J45909 Unspecified asthma, uncomplicated: Secondary | ICD-10-CM | POA: Insufficient documentation

## 2022-07-04 DIAGNOSIS — F1210 Cannabis abuse, uncomplicated: Secondary | ICD-10-CM | POA: Insufficient documentation

## 2022-07-04 DIAGNOSIS — F1410 Cocaine abuse, uncomplicated: Secondary | ICD-10-CM

## 2022-07-04 NOTE — ED Triage Notes (Signed)
Patient requesting detox treatment for his Cocaine addiction and homicidal ideation but did not disclose the person he plans to hurt. No hallucinations .

## 2022-07-04 NOTE — ED Triage Notes (Addendum)
Patient wanded by security at triage.

## 2022-07-05 LAB — RAPID URINE DRUG SCREEN, HOSP PERFORMED
Amphetamines: NOT DETECTED
Barbiturates: NOT DETECTED
Benzodiazepines: NOT DETECTED
Cocaine: POSITIVE — AB
Opiates: NOT DETECTED
Tetrahydrocannabinol: NOT DETECTED

## 2022-07-05 LAB — COMPREHENSIVE METABOLIC PANEL
ALT: 18 U/L (ref 0–44)
AST: 18 U/L (ref 15–41)
Albumin: 4 g/dL (ref 3.5–5.0)
Alkaline Phosphatase: 87 U/L (ref 38–126)
Anion gap: 10 (ref 5–15)
BUN: 13 mg/dL (ref 6–20)
CO2: 29 mmol/L (ref 22–32)
Calcium: 9.4 mg/dL (ref 8.9–10.3)
Chloride: 99 mmol/L (ref 98–111)
Creatinine, Ser: 1 mg/dL (ref 0.61–1.24)
GFR, Estimated: >60 mL/min (ref >60)
Glucose, Bld: 97 mg/dL (ref 70–99)
Potassium: 3.8 mmol/L (ref 3.5–5.1)
Sodium: 138 mmol/L (ref 135–145)
Total Bilirubin: 0.5 mg/dL (ref 0.3–1.2)
Total Protein: 6.5 g/dL (ref 6.5–8.1)

## 2022-07-05 LAB — CBC
HCT: 42.7 % (ref 39.0–52.0)
Hemoglobin: 13.7 g/dL (ref 13.0–17.0)
MCH: 30.2 pg (ref 26.0–34.0)
MCHC: 32.1 g/dL (ref 30.0–36.0)
MCV: 94.3 fL (ref 80.0–100.0)
Platelets: 297 10*3/uL (ref 150–400)
RBC: 4.53 MIL/uL (ref 4.22–5.81)
RDW: 13.2 % (ref 11.5–15.5)
WBC: 9.4 10*3/uL (ref 4.0–10.5)
nRBC: 0 % (ref 0.0–0.2)

## 2022-07-05 LAB — SALICYLATE LEVEL: Salicylate Lvl: <7 mg/dL — ABNORMAL LOW (ref 7.0–30.0)

## 2022-07-05 LAB — ACETAMINOPHEN LEVEL: Acetaminophen (Tylenol), Serum: <10 ug/mL — ABNORMAL LOW (ref 10–30)

## 2022-07-05 LAB — ETHANOL: Alcohol, Ethyl (B): <10 mg/dL (ref <10)

## 2022-07-05 NOTE — ED Notes (Signed)
Pt refused vitals and discharge instructions/papers. Pt walked out upon telling him he is being discharged. No acute distress noted.

## 2022-07-05 NOTE — ED Provider Notes (Signed)
[facility] Provider Note   CSN: [id number] Arrival date & time: 07/04/22  2321     History  Chief Complaint  Patient presents with   Drug Addiction David Lloyd is a 32 y.o. male.  32 year old male presents with concern for homelessness. States [place] is not a safe city and this makes him feel homicidal. He does not feel like he is a threat to a specific person so much as his situation. He is not suicidal. States he was last treated by [facility] last month, was dc but does not remember when his follow up appointment is. He reports a history of asthma, denies difficulty breathing tonight, chest pain, fevers, vomiting or any other complaints or concerns. States that he also uses cocaine, would like detox.       Home Medications Prior to Admission medications   Medication Sig Start Date End Date Taking? Authorizing Provider  benzonatate (TESSALON) 100 MG capsule Take 1 capsule (100 mg total) by mouth 3 (three) times daily as needed for cough. 09/13/19   Quintella Reichert, MD  ondansetron (ZOFRAN ODT) 4 MG disintegrating tablet Take 1 tablet (4 mg total) by mouth every 6 (six) hours as needed for nausea or vomiting. 06/26/18   Ward, Delice Bison, DO      Allergies    Patient has no known allergies.    Review of Systems   Review of Systems Negative except as per HPI Physical Exam Updated Vital Signs BP 121/79 (BP Location: Right Arm)   Pulse 98   Temp 98 F (36.7 C) (Oral)   Resp 17   SpO2 99%  Physical Exam Vitals and nursing note reviewed.  Constitutional:      General: He is not in acute distress.    Appearance: He is well-developed. He is not diaphoretic.  HENT:     Head: Normocephalic and atraumatic.  Cardiovascular:     Rate and Rhythm: Normal rate and regular rhythm.     Heart sounds: Normal heart sounds.  Pulmonary:     Effort: Pulmonary effort is normal.     Breath sounds: Normal breath sounds.   Abdominal:     Palpations: Abdomen is soft.     Tenderness: There is no abdominal tenderness.  Skin:    General: Skin is warm and dry.     Findings: No erythema or rash.  Neurological:     Mental Status: He is alert and oriented to person, place, and time.  Psychiatric:        Behavior: Behavior normal.     ED Results / Procedures / Treatments   Labs (all labs ordered are listed, but only abnormal results are displayed) Labs Reviewed  SALICYLATE LEVEL - Abnormal; Notable for the following components:      Result Value   Salicylate Lvl [id number] (*)    All other components within normal limits  ACETAMINOPHEN LEVEL - Abnormal; Notable for the following components:   Acetaminophen (Tylenol), Serum <10 (*)    All other components within normal limits  COMPREHENSIVE METABOLIC PANEL  ETHANOL  CBC  RAPID URINE DRUG SCREEN, HOSP PERFORMED    EKG None  Radiology No results found.  Procedures Procedures    Medications Ordered in ED Medications - No data to display  ED Course/ Medical Decision Making/ A&P  Medical Decision Making Amount and/or Complexity of Data Reviewed Labs: ordered.   32 year old male presents as above.  Patient is homeless, feels that this is not a safe city.  He does not present as a danger to a specific person or to himself.  Also requesting cocaine detox.  Discussed with ER attending, patient can be medically cleared and discharged with resources.        Final Clinical Impression(s) / ED Diagnoses Final diagnoses:  Homeless  Cocaine abuse [facility])    Rx / DC Orders ED Discharge Orders     None         Tacy Learn, PA-C 07/05/22 0106    Merryl Hacker, MD 07/06/22 [phone]

## 2022-08-09 IMAGING — CR DG CHEST 2V
2 series · 2 of 2 positions shown · non-contrast
Comparison: September 13, 2019

CLINICAL DATA: Chest pain.

EXAM:
CHEST - 2 VIEW

[chest pa]
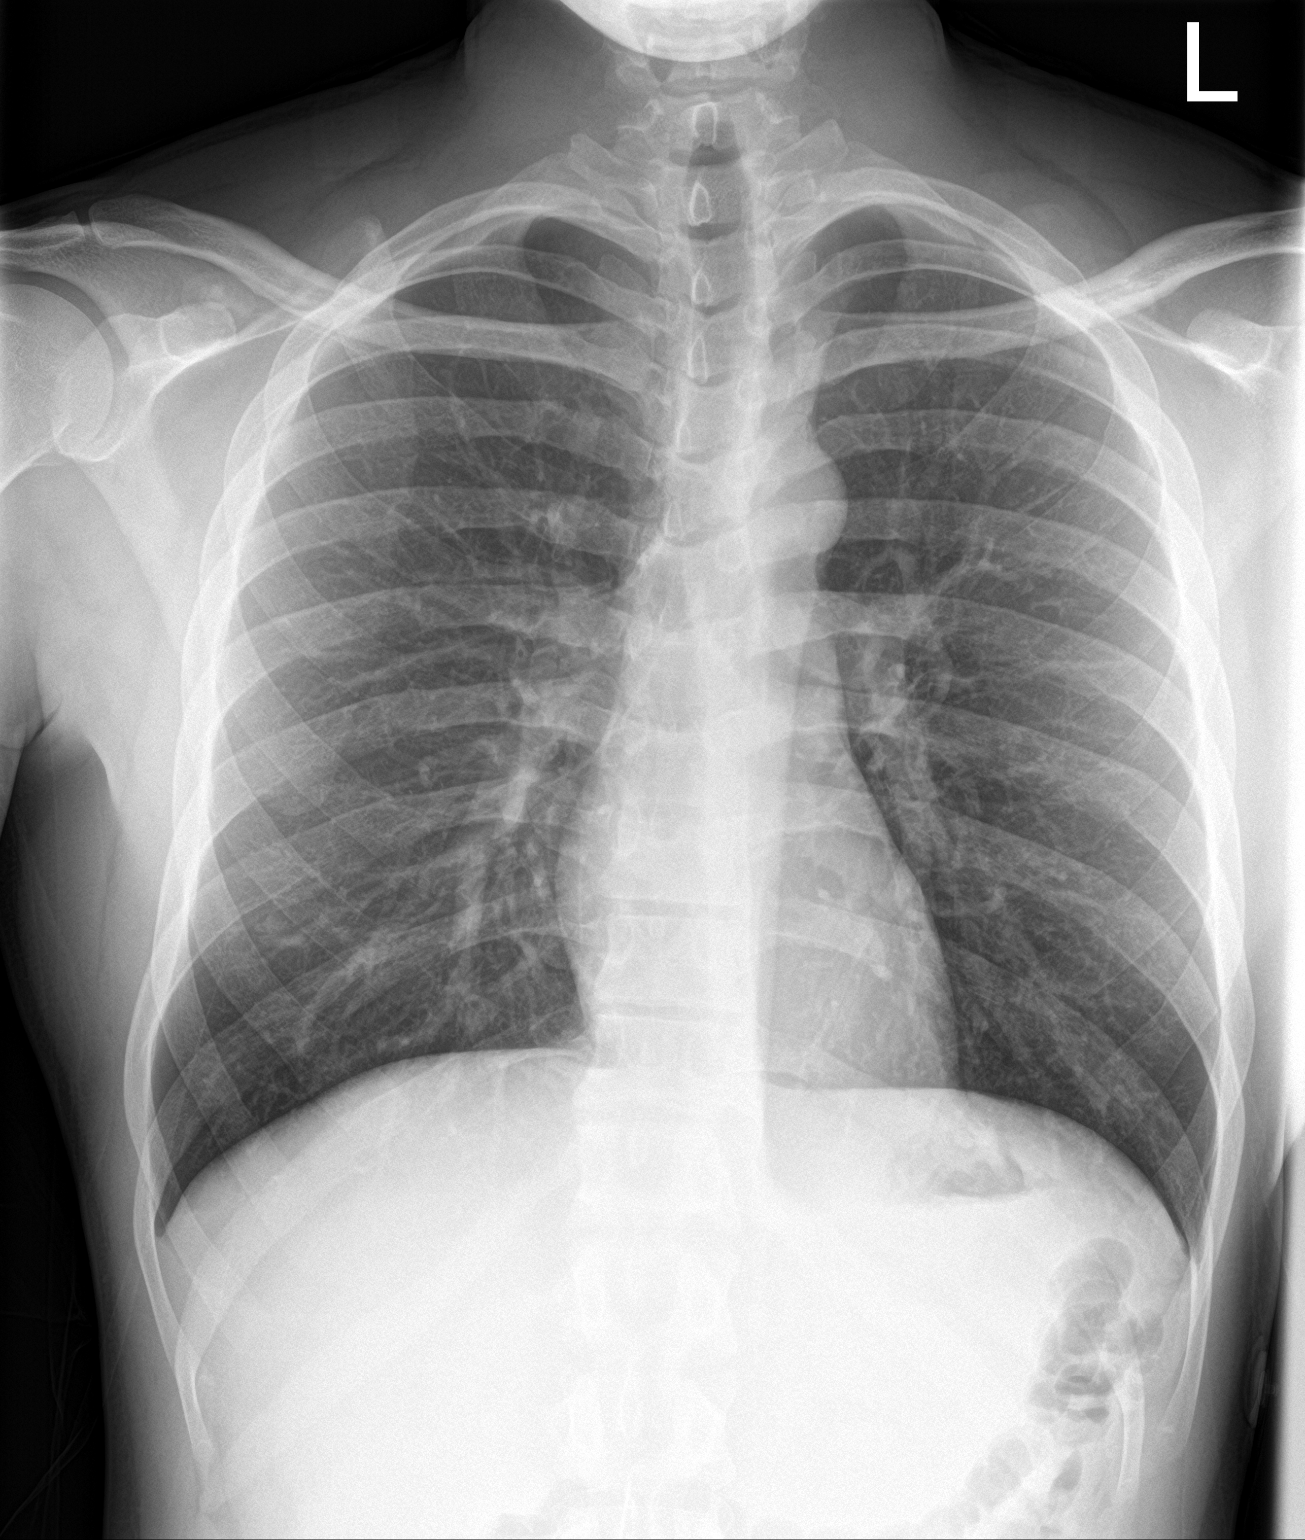

[chest lat]
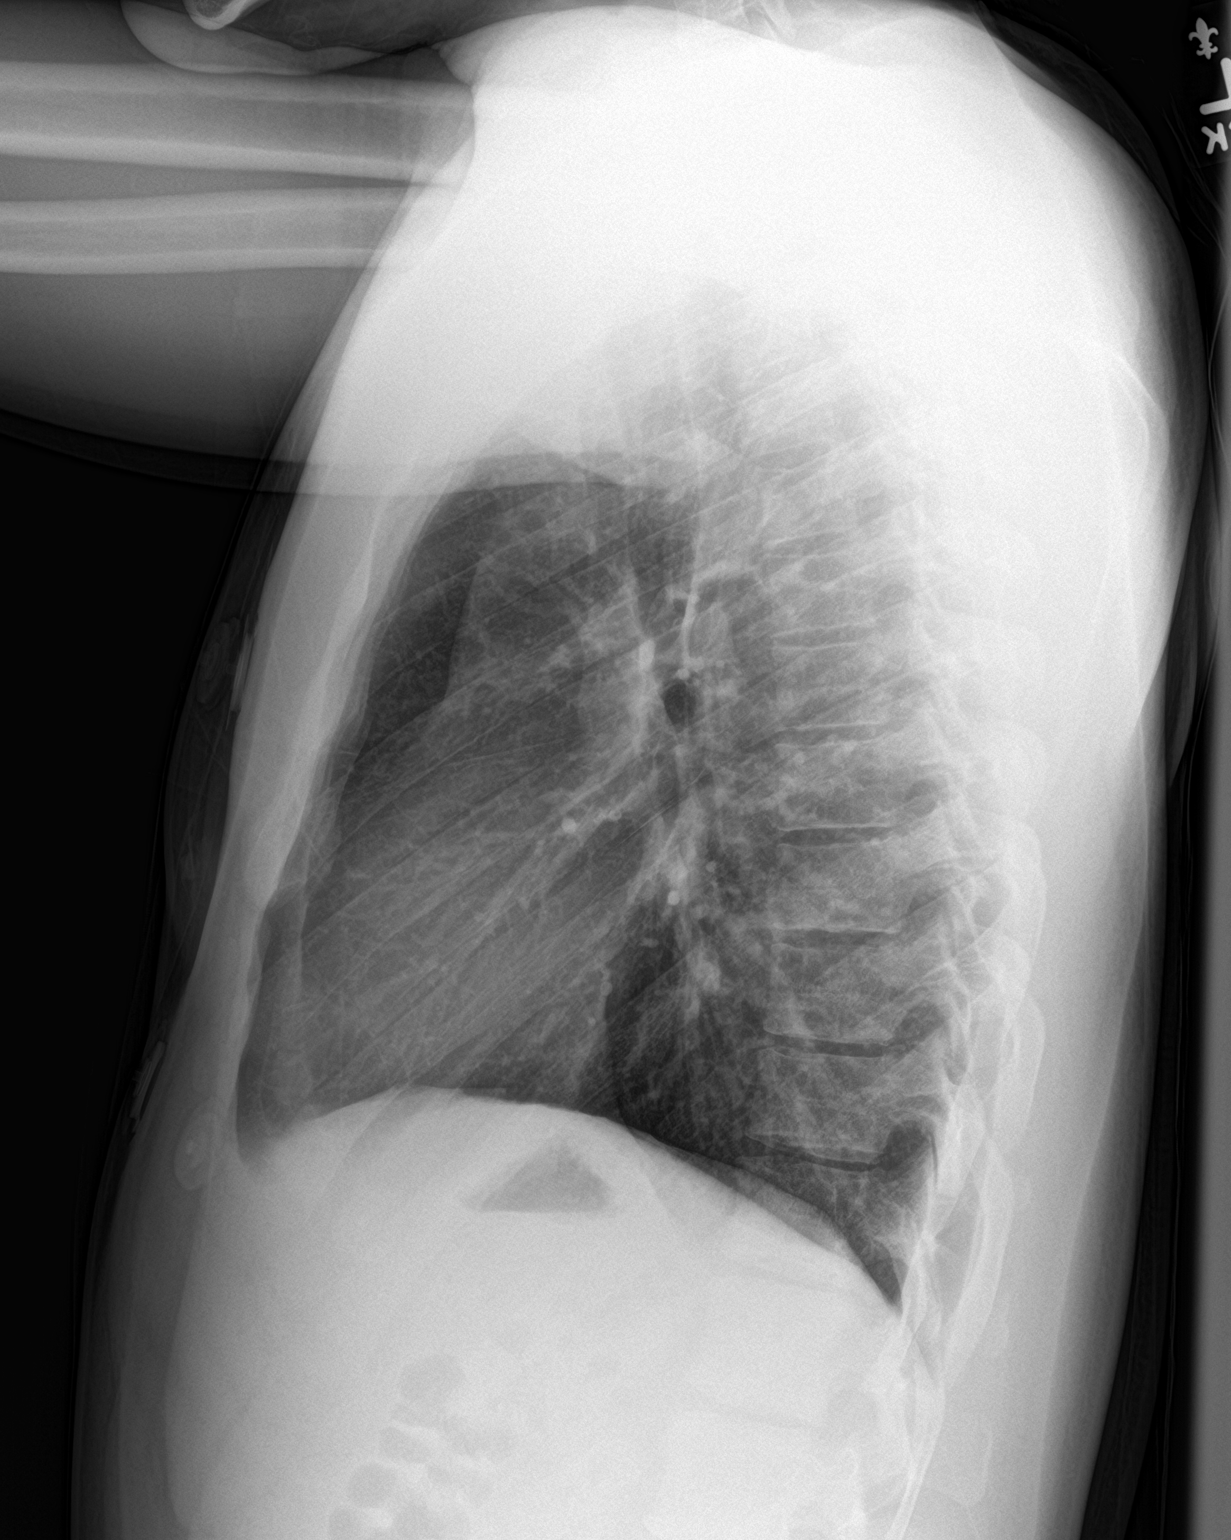

[2 of 2 positions shown; findings below may reference images not displayed]

FINDINGS: The heart size and mediastinal contours are within normal limits.
Both lungs are clear. The visualized skeletal structures are
unremarkable.
IMPRESSION: No active cardiopulmonary disease.

## 2024-01-31 NOTE — ED Provider Notes (Signed)
 ------------------------------------------------------------------------------- Attestation signed by Lonni DELENA Idol, MD at 02/01/24 1901 I have reviewed and agree with the APP's findings and plan for this patient. Lonni DELENA Idol, MD Emergency Department - 02/01/2024 7:01 PM  -------------------------------------------------------------------------------   [facility]  ED Provider Note  Pilar Corrales 33 y.o. male DOB: August 07, 1990 MRN: [id number] History   Chief Complaint  Patient presents with   Psych Problem    Pt states he has been having thoughts of wanting to hurt himself by overdosing on drugs and states he has a problem with mental health and need help with everything. States thoughts have been going on awhile. VSS. GCS 15.    History provided by:  Patient  33 year old male with history of depression presents to the emergency department complaining of increased depression and suicidal thoughts for the past few days.  Patient was planning to overdose on drugs.  Patient admits to cocaine and alcohol abuse.  He denies any fever chills cough congestion.  No abdominal pain nausea vomiting or diarrhea.    Past Medical History:  Diagnosis Date   Asthma (*)     Past Surgical History:  Procedure Laterality Date   Appendectomy      Social History   Substance and Sexual Activity  Alcohol Use Yes   Tobacco Use History[1] E-Cigarettes   Vaping Use Never User    Start Date     Cartridges/Day     Quit Date     Social History   Substance and Sexual Activity  Drug Use Not Currently         Allergies[2]  Discharge Medication List as of 02/01/2024  1:09 AM     CONTINUE these medications which have NOT CHANGED   Details  lidocaine 4 % Follow dosing instructions on package., Normal    meloxicam (MOBIC) 15 mg tablet Take one tablet (15 mg dose) by mouth daily for 10 days., Starting Wed 02/15/2022, Until Sat  02/25/2022, Normal        Primary Survey  Primary Survey  Review of Systems   Review of Systems  Constitutional:  Negative for chills and fever.  HENT:  Negative for congestion.   Respiratory:  Negative for cough.   Cardiovascular:  Negative for chest pain.  Gastrointestinal:  Negative for abdominal pain, diarrhea, nausea and vomiting.  Psychiatric/Behavioral:  Positive for suicidal ideas. Negative for hallucinations.   All other systems reviewed and are negative.   Physical Exam   ED Triage Vitals [01/31/24 2136]  BP (!) 140/94  Heart Rate 106  Resp 18  SpO2 97 %  Temp 98.3 F (36.8 C)    Physical Exam  Nursing note and vitals reviewed. Constitutional: He appears well-developed and well-nourished. He appears disheveled. He does not appear distressed and does not appear ill.  HENT:  Head: Normocephalic and atraumatic.  Right Ear: Normal external ear.  Left Ear: Normal external ear.  Nose: Nose normal.  Mouth/Throat: Voice normal.  Eyes: Conjunctivae are normal.  Neck: Normal range of motion and voice normal. Neck supple.  Cardiovascular: Regular rhythm and normal heart sounds. Tachycardia present.  No audible murmur. No friction rub and gallop.  Pulmonary/Chest: Respiratory effort normal and breath sounds normal.  Abdominal: Soft. There is no abdominal tenderness. There is no guarding and no rebound. Bowel sounds are normal.  Musculoskeletal: Normal range of motion.     Cervical back: Normal range of motion and neck supple.   Neurological: He is alert and oriented to person, place,  and time. Moves all extremities equally. He has normal speech.  Skin: Skin is warm. Skin is dry.  Psychiatric: He does not appear to be responding to internal auditory stimuli. His speech is normal. His behavior is normal. Thought content is not paranoid. He exhibits a depressed mood. He expresses suicidal ideation. He expresses no homicidal ideation. He expresses suicidal plans.      ED Course   Lab results:   CBC AND DIFFERENTIAL - Abnormal      Result Value   WBC 13.6 (*)    RBC 4.60 (*)    HGB 14.4     HCT 42.3     MCV 92.0     MCH 31.3     MCHC 34.0     Plt Ct 275     RDW SD 45.6     MPV 9.4     NRBC% 0.0     Absolute NRBC Count 0.00     NEUTROPHIL % 68.9     LYMPHOCYTE % 22.1     MONOCYTE % 7.6     Eosinophil % 0.4     BASOPHIL % 0.5     IG% 0.5     ABSOLUTE NEUTROPHIL COUNT 9.33 (*)    ABSOLUTE LYMPHOCYTE COUNT 2.99     Absolute Monocyte Count 1.03 (*)    Absolute Eosinophil Count 0.06     Absolute Basophil Count 0.07     Absolute Immature Granulocyte Count 0.07 (*)   COMPREHENSIVE METABOLIC PANEL - Abnormal   Na 142     Potassium 3.6 (*)    Cl 106     CO2 22     AGAP 14     Glucose 92     BUN 7     Creatinine 0.96     Ca 9.3     ALK PHOS 92     T Bili 0.3     Total Protein 7.3     Alb 4.6     GLOBULIN 2.7     ALBUMIN/GLOBULIN RATIO 1.7     BUN/CREAT RATIO 7.3 (*)    ALT 20     AST 34     eGFR 107     Comment: Normal GFR (glomerular filtration rate) > 60 mL/min/1.73 meters squared, < 60 may include impaired kidney function. Calculation based on the Chronic Kidney Disease Epidemiology Collaboration (CK-EPI)equation refit without adjustment for race.  URINE DRUGS OF ABUSE SCRN - Abnormal   Ur PH DOA Scr 5.6     Amphet Scr Negative     Barb Scr Negative     Benzo Scr Negative     Cannab Scr Negative     Cocaine Scr Positive (*)    Opiates Scr Negative     Meth Scr Negative     Oxyco Scr Negative     Fentanyl Scr Negative     Buprenorphine Screen Negative     Narrative:    Please Note Detection Levels Below:                            Amphetamines                    1000 ng/mL  Barbiturates                    200 ng/mL  Benzodiazepines  200 ng/mL  Cannabinoids (Marijuana, THC)   50 ng/mL  Cocaine                         300 ng/mL  Opiates                         300 ng/mL  Methadone                        300 ng/mL  Oxycodone                       100 ng/mL  Fentanyl                          5 ng/mL  Buprenorphine                     5 ng/mL  This test is a screening test and results are only to be used for medical purposes.  If confirmation of positive results are needed, please order confirmation by GC/MS for each drug that needs confirmation.  Urine specimens are retained for 5 days.   ETHANOL - Normal   Ethanol 85     Comment: Blood Alcohol Level is for Medical Purposes Only.    Imaging: No data to display   ECG: ECG Results   None                                                                        Pre-Sedation Procedures    Medical Decision Making Patient presents with increased depression and suicidal ideation differentials include substance induced mood disorder, depression with suicidal ideation, metabolic derangement, polysubstance abuse, amongst others  CBC shows white count of 13.6.  Hemoglobin normal at 14.4.  Electrolytes are essentially normal normal renal function.  Normal LFTs.  Urine drug screen was positive for cocaine.  Alcohol level was 85  The patient is currently medically stable, and appropriate for behavioral health evaluation. Presentation is more consistent with functional psychiatric disorder than secondary to organic pathology or delirium. Patient would benefit from psychiatric evaluation to lend expertise and help determine the best disposition and treatment plan.  To ensure ongoing care as patient awaits BH evaluation, the following measures have been taken: -All acute medical issues identified on ED evaluation have been addressed  -Behavioral Health ED holding orders have been placed (including CIWA/COWS orders as needed) -Home Medications will be addressed through medication reconciliation process -Dietary orders, including blood sugar control, have been placed.   Patient was seen by access  and deemed to be malingering and safe for discharge.  They believe he needs drug treatment.  Amount and/or Complexity of Data Reviewed Labs: ordered. Decision-making details documented in ED Course. Discussion of management or test interpretation with external provider(s): 10:54 PM Discussed with access they will see the patient.  Risk OTC drugs. Prescription drug management. Decision regarding hospitalization.          Provider Communication  Discharge Medication List as of 02/01/2024  1:09 AM      Discharge Medication List as of 02/01/2024  1:09  AM      Discharge Medication List as of 02/01/2024  1:09 AM      Clinical Impression Final diagnoses:  Substance induced mood disorder (*)  Polysubstance abuse (*)    ED Disposition     ED Disposition  Discharge   Condition  Stable   Comment  --                 Follow-up Information     [facility]. Call.   Comments: Mudlogger information: [address] [place]  [address] [phone]        [facility] Urgent Care. Go in 1 day(s).   Comments: As needed Contact information: [address] [place], [place] [address] [phone]                 Electronically signed by:       [1] Social History Tobacco Use  Smoking Status Every Day   Types: Cigarettes  Smokeless Tobacco Never  [2] Allergies Allergen Reactions   Trazodone Rash   Tomato Nausea Only   Alyce MYRTIS Sharps, PA-C 02/01/24 [phone]

## 2024-02-01 NOTE — Progress Notes (Signed)
 [facility] -Behavioral Health Diagnostic Evaluation Behavioral Health Crisis Access Screening Face to face   Patient location at time of Tele-Consult: [facility] Provider Location at time of Tele-Consult: [facility]  Patient Name:  David Lloyd Date of Birth:  January 31, 1991  Today's Date:  February 01, 2024  Today's encounter included additional complexity due to: Housing instability and limited supports.   David Lloyd Safety planning intervention was Completed during the visit. Total time spent on safety planning was 11 minutes.   Provisional Diagnosis  Provisional Diagnosis: F32.9 Unspecified Depressive Disorder Primary Presenting Problem: Mental Health  Formulation: David Lloyd is a 33 y.o. male with a history of Depression who presented to the Patient location at time of Tele-Consult: [facility].  . Patient is voluntarily and presents in ED due to suicidal thoughts secondary to housing instability.    Pt reported that he has been having suicidal thoughts for one week due to homelessness. He reported that he regularly uses alcohol and cocaine and considered using it more. HI and AVH were denied.   Based on my assessment, the recommendation is for reassessment by psychiatry for further evaluation and disposition planning. BH Consult reviewed with ED provider, David Sharps, PA-C, who was informed of the disposition plan and is in agreement with the plan. ED provider confirms patient's commitment status at present to be voluntary. Based on the [facility] Consult findings, the patient's commitment status will be remain voluntary. Next steps will include discharge to outpatient level of care with resources provided. Legal status/disposition recommendation was reviewed with the patient.  Disposition  ED Provider Contact Name: David Sharps, PA-C MD Contact Date: 02/01/24 MD Contact Time:  0031 Disposition Recommendation: Outpatient Outpatient Type: SA;MH Outpatient Disposition: Pt can follow up with and utilize community resources for substance use. Reason Not Admitted: Not Suicidal;Not Homicidal;Not Psychotic;Able to function with available [employer]. Disposition Comments: Pt do not meet inpatient admission criteria. Pt is not suicidal, homicidal or psychotic. Pt can function with available community resources for SA and MH treatment. ED provider agrees with [facility] disposition. Individual to contact with disposition updates (name and number): [facility] [phone]     Triage Screen  ED Triage Access Screening : The patient is not exhibiting and/or reporting manic behaviors.;The patient does not present with psychotic symptoms, hallucinations, delusions, and/or paranoia.;The patient did not arrive under IVC or TDO.;The patient is not experiencing suicidal/homicidal ideations.   General Information  Type of Screen: If NOT Face to Face, Skip to Disposition Section): Face to Face Referral Source Name and Contact Number: Self Release Signed: No Referral Source Contacted: Yes Release for Community Providers: No Information Provided By:: Patient Court Appointed Guardian: No What brought the patient to the ED/circumstances prompting assessment: Pt is a 33 year old presenting to the ED for SI and substance use. Pt reported that he has been having suicidal thoughts for one week due to homelessness. He reported that he regularly uses alcohol and cocaine and considered using it more. HI and AVH were denied. Presenting Problems: Substance Abuse;Suicidal Threats/Gestures Duration of Presenting Problems: 73 hours-1 week Outside help or community services at home: None Is there anyone that you know, or are related to, on the Behavioral Health unit?: No Have you ever been in the Service?: No  Potential Risk to Self  Suicidal Thoughts: Infrequent or passive thoughts without plan  or intent Suicide Plan: Unrealistic or low lethality with no intent-unavailable means Suicidal Intent: Passive desire to  die without self-injurious actions or intent History of Suicide Attempts: No previous history Lethality of Past Self-Injurious Behavior: None present Self-harm/self-injurious behaviors: Denies Access to means/weapons to harm self: Denies  Potential Risk to Others  Homicidal Ideations: Denies Current Plan: Denies  Symptoms  Sleep pattern changed: No Sleeping increased: No Sleeping decreased: No Problems: Yes Other Sleep Problem Detail: Difficulty due to homelessness Use sleep aid: No  Have you lost weight recently without trying?: No How much weight have you lost?: None Have you been eating poorly because of a decreased appetite?: No Behaviors indicative of an eating disorder: No  Hopelessness/Helplessness: Yes Crying spells/mood swings: Yes Low energy/fatigue: No Concentration problems: Yes Psychomotor retardation/agitation: No Feelings of guilt/worthlessness: Yes Social withdrawal: No Recurrent thoughts of death: No Deterioration in Activities of Daily Living: Yes  Rapid pressured speech: No Increase in impulsivity: No Increase in energy: No Flight of ideas/loose association: No  Excessive worry: Yes Nervousness: Yes Irritability: Yes Shortness of breath: No Racing heart rate: No Sweaty/Chills/Hot flashes: No Nausea/Vomiting/Diarrhea: No Chest Pain: No  Additional Symptom Information: Pt  was calm and cooperative but provided vague responses throughout the assessment. Records reveal a history of substance use and a dx of schizophrenia however pt stated his dx was depression only. He denied being on  psychotropic medication.  He reported experiencing suicidal thoughts for approximately one week, attributing them to homelessness. Pt stated his family resides in [place] but expressed no interest in returning to that area. He acknowledged current  use of cocaine and alcohol and disclosed having thoughts of overdosing by using an excessive amount of drugs. He declined substance abuse treatment, citing previous attempts without progress. He reported sleeping only three to four hours per night due to housing instability. Pt endorsed mood swings, difficulty concentrating, social withdrawal from his family, and a decline in activities of daily living related to lack of income and housing insecurity. He was easily irritated during the interview, stating he wanted to return to sleep. Despite ongoing stressors, pt appeared future-oriented when discussing stability but admitted he was unsure how to apply for services such as disability or case management.  Psychosis  Coherency (the How): Coherent and organized Content (the What): Rational, organized, no evidence of psychosis Delusions: None Hallucinations: None  Treatments  Treatments?: Yes Treatment Date:  (2024) Treatment Provider/Location: [facility] Type: Behavioral Health Treatment Date of Next Appt or Last Appt: Mulitple ED visits Additional Treatment?: Yes Treatment 2 Date:  (2024) Treatment 2 Provider/Location: [facility] Type: Chemical Dependency Did you follow up with your aftercare appointment?: N/A Did you take your medication as prescribed?: N/A  Substance Use  Substance use in past 12 months?: Yes Drug Screen: Positive History of Substance Use/Abuse:: Yes Marijuana Use?: No Cocaine/Crack Use?: Patient Reports Cocaine/Crack - Amount/Frequency: a lot Cocaine/Crack - Route of use: Smoking Cocaine/Crack -  Age of First Use: 22 yrs Cocaine/Crack -  Duration of Current Use: varies Cocaine/Crack -  Last Used: Yesterday Cocaine/Crack -  Longest Period Not Used: I don't know Cocaine/Crack - How is it being acquired?: Buying off the street Opiates/Analgesics Use?: No Amphetamine Use?: No Sedative/Hypnotic Use?: No Club Drugs (Ecstasy) Use?:  No Hallucinogen Use?: No Inhalant Use?: No Tobacco/Nicotine  Use?: No Other Drug Use?: No Withdrawal Potential: Denies Any Problems Related to Substance Use/Abuse: Occupational problems;Financial problems;Family/Friends concerned Other non-substance addictive behaviors:: none reported  Opiod Use Disorder         Clinical Opiate Withdrawal Scale Heart Rate: 94 Heart Rate Source:  Monitor     Alcohol Use  Alcohol abuse in past 12 months?: Yes History of Alcohol Use/Abuse:: Yes Alcohol Use?: Patient Reports Alcohol Amount/Frequency: Unspecified Alcohol -  Age of First Use: 14 yrs Alcohol -  Last Used: Yesterday Alcohol Withdrawal Potential: Denies Any Problems Related to Alcohol Use/Abuse: Denies Any #1. How often do you have a drink containing alcohol?: 2-3 times/week #2. How many drinks containing alcohol do you have on a typical day when you are drinking?: 3 or 4 #3  How often do you have six or more drinks on one occasion?: Less than monthly #4 How often during the last year have you found that you were not able to stop drinking once you had started?: Monthly #5 How often during the last year have you failed to do what was normally expected from you because of drinking?: Less than monthly #6  How often during the last year have you needed a first drink in the morning to get yourself going after a heavy drinking session?: Less than monthly #7 How often during the last year have you had a feeling of guilt or remorse after drinking?: Less than monthly #8  How often during the last year have you been unable to remember what happened the night before because you had been drinking?: Less than monthly #9  Have you or someone else been injured as a result of your drinking?: No #10  Has a relative, friend, doctor or health worker been concerned about your drinking or suggested you cut down?: No AUDIT tool score: 11 Dimension 1: Intoxication withdrawal and addiction medicines  (associated risks and needs): Low Dimension 2: Biomedical conditions and complications (not related to withdrawal; physical health or pregnancy related concerns): Low Dimension 3:  Psychiatric and cognitve history (active psy symptoms, persistent disability, cognitive functioning): Low Dimension 4: Substance use-related risks: (likelihood of engaging in risky substance use or SUD related behaviors): Medium Dimension 5:  Recovery environment interactions (ability to function effectively, safety, and support in current environment): Low Dimension 6:  Barriers to care:: Lack of Insight;Housing Insecurity;Financial Challenges;Transportation Patient preferences of care:: No Desire for Tx;IOP Appropriate Level of Care Patient is appropriate for the following level of care (choose the most acute problem area that applies):: Level 2.5: Partial Hospitalization Services (>20 hr/wk, but not requiring 24 hr care)       Functioning  Dressing: Independent Bathing: Independent Toileting: Independent Feeding: Independent Hearing - Right Ear: Functional Hearing - Left Ear: Functional Patient Fall Risk Level: Low/medium Date of last yearly physical:: Unknown Possible barriers to participate in Treatment/Programming?: Yes Describe barrier to Treatment/Programming: Finances and housing insecurities Patient admitted from: Homeless/No Shelter Able to return to Current Living Arrangements?: Yes Support System:: None reported Are there children in the home under the age of 53?: No What is your job situation right now?: Unemployed/Not Seeking Work Problems at work?: No Financial concerns: Housing;Food Recreational/Leisure activities: None reported Religious/Spiritual orientation: None reported Cultural Preferences: None reported  Strengths/Limitations  Strength 1: Assertive Strength 2: Communication Strength 3: Seeking help    BH History  History of Abuse?: No Trauma: None reported Neglect:  None reported Exploitation: None reported Bereavement: None reported  Legal Issues  Legal: No Engineer, Drilling?: No  Child/Adolescent Assessment  Child / Adolescent?: No  Collateral Contacts  Collateral Contact Info: Pt Refused Reason Patient Refused: Pt denied having supports and collaterals.  Mental Status  General Appearance: equal to stated age Motor Activity: normal Speech: minimal;soft Exhibited Behavior: cooperative;guarded Affect  Range /Display: normal range Mood Range /Display: Normal range Affect/Mood Display: Calm Mood: depressed Thought Process: wdl Thought Content: WDL Insight: developing Orientation To:: Person (Yes);Place (Yes);Situation (Yes);Date (Yes)      Electronically signed: Annabella LITTIE Lesches, LCSW 02/01/2024 / 1:30 AM

## 2024-05-04 ENCOUNTER — Emergency Department (HOSPITAL_COMMUNITY)
Admission: EM | Admit: 2024-05-04 | Discharge: 2024-05-04 | Disposition: A | Discharge status: Home / Self Care | Payer: MEDICAID | Attending: Emergency Medicine | Admitting: Emergency Medicine

## 2024-05-04 ENCOUNTER — Encounter (HOSPITAL_COMMUNITY): Payer: Self-pay

## 2024-05-04 ENCOUNTER — Emergency Department (HOSPITAL_COMMUNITY): Payer: MEDICAID

## 2024-05-04 ENCOUNTER — Other Ambulatory Visit: Payer: Self-pay

## 2024-05-04 DIAGNOSIS — J45909 Unspecified asthma, uncomplicated: Secondary | ICD-10-CM | POA: Insufficient documentation

## 2024-05-04 DIAGNOSIS — J069 Acute upper respiratory infection, unspecified: Secondary | ICD-10-CM | POA: Insufficient documentation

## 2024-05-04 DIAGNOSIS — Z7951 Long term (current) use of inhaled steroids: Secondary | ICD-10-CM | POA: Insufficient documentation

## 2024-05-04 DIAGNOSIS — R059 Cough, unspecified: Secondary | ICD-10-CM | POA: Diagnosis present

## 2024-05-04 LAB — RESP PANEL BY RT-PCR (RSV, FLU A&B, COVID)  RVPGX2
Influenza A by PCR: NEGATIVE
Influenza B by PCR: NEGATIVE
Resp Syncytial Virus by PCR: NEGATIVE
SARS Coronavirus 2 by RT PCR: NEGATIVE

## 2024-05-04 MED ORDER — PREDNISONE 20 MG PO TABS: 40.0000 mg | ORAL_TABLET | Freq: Every day | ORAL | 0 refills | Status: AC

## 2024-05-04 MED ORDER — PREDNISONE 20 MG PO TABS
40.0000 mg | ORAL_TABLET | Freq: Once | ORAL | Status: AC
  Administered 2024-05-04: 40 mg via ORAL
  Filled 2024-05-04: qty 2

## 2024-05-04 MED ORDER — ALBUTEROL SULFATE HFA 108 (90 BASE) MCG/ACT IN AERS
2.0000 | INHALATION_SPRAY | Freq: Once | RESPIRATORY_TRACT | Status: AC
  Administered 2024-05-04: 2 via RESPIRATORY_TRACT
  Filled 2024-05-04: qty 6.7

## 2024-05-04 NOTE — ED Provider Triage Note (Signed)
 Emergency Medicine Provider Triage Evaluation Note  David Lloyd , a 34 y.o. male  was evaluated in triage.  Pt complains of since emergency department with a chief complaint of cough, congestion, loss of taste and smell, as well as shortness of breath.  Patient has a history of asthma, has not had to use his albuterol  inhaler or any asthma medications for some time.  Denies chest pain.  Patient states that he has had symptoms for approximately 3 days.  He also appreciates throat pain.  No known sick contacts.  Review of Systems  Positive: Throat pain, congestion, shortness of breath Negative: Fever, chills, chest pain  Physical Exam  BP 107/71 (BP Location: Right Arm)   Pulse (!) 102   Temp (!) 97.2 F (36.2 C) (Oral)   Resp 17   Ht 5' 4 (1.626 m)   Wt 59.4 kg   SpO2 98%   BMI 22.49 kg/m  Gen:   Awake, no distress   Resp:  Normal effort, talking in full sentences on room air MSK:   Moves extremities without difficulty  Other:    Medical Decision Making  Medically screening exam initiated at 7:27 PM.  Appropriate orders placed.  David Lloyd was informed that the remainder of the evaluation will be completed by another provider, this initial triage assessment does not replace that evaluation, and the importance of remaining in the ED until their evaluation is complete.  Orders: Viral swabs, chest x-ray   David Lloyd David Lloyd, [place] 05/04/24 1928

## 2024-05-04 NOTE — ED Provider Notes (Signed)
 " [place] EMERGENCY DEPARTMENT AT [facility] Provider Note   CSN: [id number] Arrival date & time: 05/04/24  1907     Patient presents with: Sore Throat   David Lloyd is a 34 y.o. male.   34 yo M hx of asthma who presents with URI symptoms. Pt states that for the past 3 days has been having cough, sore throat, loss of taste and smell. Also chills. No fevers. Out of his inhaler.       Prior to Admission medications  Medication Sig Start Date End Date Taking? Authorizing Provider  predniSONE  (DELTASONE ) 20 MG tablet Take 2 tablets (40 mg total) by mouth daily for 4 days. 05/04/24 05/08/24 Yes Yolande Lamar BROCKS, MD  benzonatate  (TESSALON ) 100 MG capsule Take 1 capsule (100 mg total) by mouth 3 (three) times daily as needed for cough. 09/13/19   Griselda Norris, MD  ondansetron  (ZOFRAN  ODT) 4 MG disintegrating tablet Take 1 tablet (4 mg total) by mouth every 6 (six) hours as needed for nausea or vomiting. 06/26/18   Ward, Josette SAILOR, DO    Allergies: Patient has no known allergies.    Review of Systems  Updated Vital Signs BP 107/71 (BP Location: Right Arm)   Pulse (!) 102   Temp (!) 97.2 F (36.2 C) (Oral)   Resp 17   Ht 5' 4 (1.626 m)   Wt 59.4 kg   SpO2 98%   BMI 22.49 kg/m   Physical Exam Vitals and nursing note reviewed.  Constitutional:      General: He is not in acute distress.    Appearance: He is well-developed.  HENT:     Head: Normocephalic and atraumatic.     Right Ear: External ear normal.     Left Ear: External ear normal.     Nose: Nose normal.     Mouth/Throat:     Mouth: Mucous membranes are moist.     Pharynx: Oropharynx is clear. No oropharyngeal exudate.  Eyes:     Extraocular Movements: Extraocular movements intact.     Conjunctiva/sclera: Conjunctivae normal.     Pupils: Pupils are equal, round, and reactive to light.  Cardiovascular:     Rate and Rhythm: Normal rate and regular rhythm.     Heart sounds: Normal heart sounds.   Pulmonary:     Effort: Pulmonary effort is normal. No respiratory distress.     Breath sounds: Wheezing (faint expiratory) present.  Musculoskeletal:     Cervical back: Normal range of motion and neck supple.  Skin:    General: Skin is warm and dry.  Neurological:     Mental Status: He is alert. Mental status is at baseline.  Psychiatric:        Mood and Affect: Mood normal.        Behavior: Behavior normal.     (all labs ordered are listed, but only abnormal results are displayed) Labs Reviewed  RESP PANEL BY RT-PCR (RSV, FLU A&B, COVID)  [id number]    EKG: None  Radiology: DG Chest 2 View Result Date: 05/04/2024 EXAM: 2 VIEW(S) XRAY OF THE CHEST 05/04/2024 07:38:00 PM COMPARISON: 09/17/2021 CLINICAL HISTORY: shortness of breath FINDINGS: LUNGS AND PLEURA: No focal pulmonary opacity. No pleural effusion. No pneumothorax. HEART AND MEDIASTINUM: No acute abnormality of the cardiac and mediastinal silhouettes. BONES AND SOFT TISSUES: No acute osseous abnormality. IMPRESSION: 1. No acute findings. Electronically signed by: Oneil Devonshire MD 05/04/2024 07:47 PM EST RP Workstation: [id number]     Procedures  Medications Ordered in the ED  predniSONE  (DELTASONE ) tablet 40 mg (40 mg Oral Given 05/04/24 2029)  albuterol  (VENTOLIN  HFA) 108 (90 Base) MCG/ACT inhaler 2 puff (2 puffs Inhalation Given 05/04/24 2029)                                    Medical Decision Making Risk Prescription drug management.   David Lloyd is a 34 y.o. male with comorbidities that complicate the patient evaluation including asthma who presents with URI symptoms  Initial Ddx:  URI, sinusitis, pneumonia, pharyngitis  MDM:  Feel the patient likely has a URI based on their symptoms.  Does have faint expiratory wheezing and history of asthma.  Reports he is out of his inhaler.  Will give him an inhaler here as well as a dose of prednisone .  Will get a chest x-ray to ensure there is no pneumonia.  Feel  sinusitis unlikely. Low concern for strep throat at this point.   Plan:  COVID/flu Chest x-ray Albuterol  Prednisone   ED Summary/Re-evaluation:  Patient reevaluated in the emergency department and was stable. Covid and flu swab negative.  Chest x-ray without pneumonia.  Given prednisone  course as well. Will have them call their PCP in several days to ensure they are recovering as expected.   This patient presents to the ED for concern of complaints listed in HPI, this involves an extensive number of treatment options, and is a complaint that carries with it a high risk of complications and morbidity. Disposition including potential need for admission considered.   Dispo: DC Home. Return precautions discussed including, but not limited to, those listed in the AVS. Allowed pt time to ask questions which were answered fully prior to dc.  Records reviewed ED Visit Notes I independently reviewed the following imaging with scope of interpretation limited to determining acute life threatening conditions related to emergency care: Chest x-ray and agree with the radiologist interpretation with the following exceptions: none I have reviewed the patients home medications and made adjustments as needed  Portions of this note were generated with Dragon dictation software. Dictation errors may occur despite best attempts at proofreading.     Final diagnoses:  Upper respiratory tract infection, unspecified type  Uncomplicated asthma, unspecified asthma severity, unspecified whether persistent    ED Discharge Orders          Ordered    predniSONE  (DELTASONE ) 20 MG tablet  Daily        05/04/24 2026               Yolande Lamar BROCKS, MD 05/04/24 2037  "

## 2024-05-04 NOTE — Discharge Instructions (Signed)
 You were seen for your upper respiratory tract infection in the emergency department.   At home, please use Tylenol  and ibuprofen  for your muscle aches and fevers.  Please use over-the-counter cough medication or tea with honey for your cough.  Take the prednisone  for your asthma and use your albuterol  inhaler as needed for wheezing or shortness of breath  Follow-up with your primary doctor in 2-3 days regarding your visit.  This may be over the phone.  Return immediately to the emergency department if you experience any of the following: Difficulty breathing, or any other concerning symptoms.    Thank you for visiting our Emergency Department. It was a pleasure taking care of you today.

## 2024-05-04 NOTE — ED Triage Notes (Signed)
 Pt complaining of sore throat and loss of taste and smell for 3 days.

## 2024-05-07 ENCOUNTER — Emergency Department (HOSPITAL_COMMUNITY)
Admission: EM | Admit: 2024-05-07 | Discharge: 2024-05-07 | Disposition: A | Discharge status: Home / Self Care | Payer: MEDICAID | Source: Home / Self Care | Attending: Emergency Medicine | Admitting: Emergency Medicine

## 2024-05-07 DIAGNOSIS — W260XXA Contact with knife, initial encounter: Secondary | ICD-10-CM | POA: Diagnosis not present

## 2024-05-07 DIAGNOSIS — S6992XA Unspecified injury of left wrist, hand and finger(s), initial encounter: Secondary | ICD-10-CM | POA: Diagnosis present

## 2024-05-07 DIAGNOSIS — S61211A Laceration without foreign body of left index finger without damage to nail, initial encounter: Secondary | ICD-10-CM | POA: Insufficient documentation

## 2024-05-07 NOTE — ED Triage Notes (Signed)
 Pt brought by [facility], was found on was station nearby, complaints of laceration on left index finger. Reports not been able to feel it, but still able to move it.   Admitted use of cocaine and marihuana  today. Per EMS patient complained of chest discomfort but resolved.   Vitals with EMS BP:112/70, HR101 99%RA CBG 134.  Pt admitted cutting himself with knife, denies SI. Aox4.

## 2024-05-07 NOTE — Discharge Instructions (Signed)
 It was a pleasure meeting you today. We used dermabond or skin glue to repair your finger laceration today. Follow-up with your primary care physician as needed for ongoing wound recheck. Return to ED if you develop any signs of infection like increased pain to the wound, drainage from the wound, fevers, or any other new concerning symptoms.

## 2024-05-07 NOTE — ED Provider Notes (Cosign Needed Addendum)
 " [place] EMERGENCY DEPARTMENT AT [facility] Provider Note   CSN: [id number] Arrival date & time: 05/07/24  0559     Patient presents with: Laceration (Left index finger/)   David Lloyd is a 34 y.o. male. With pertinent medical history of asthma and schizophrenia.   Patient is here for a laceration to index finger of left hand. Patient arrived by EMS. Reports he sustained this laceration about 1 to 2 hours ago when opening a knife.  This was accidental and he had no intention of harming himself. Denies LOC. Patient also requesting food as he has not eaten recently.  The history is provided by the patient.  Laceration Location:  Hand Hand laceration location:  L hand      Prior to Admission medications  Medication Sig Start Date End Date Taking? Authorizing Provider  benzonatate  (TESSALON ) 100 MG capsule Take 1 capsule (100 mg total) by mouth 3 (three) times daily as needed for cough. 09/13/19   Griselda Norris, MD  ondansetron  (ZOFRAN  ODT) 4 MG disintegrating tablet Take 1 tablet (4 mg total) by mouth every 6 (six) hours as needed for nausea or vomiting. 06/26/18   Ward, Josette SAILOR, DO  predniSONE  (DELTASONE ) 20 MG tablet Take 2 tablets (40 mg total) by mouth daily for 4 days. 05/04/24 05/08/24  Yolande Lamar BROCKS, MD    Allergies: Patient has no known allergies.    Review of Systems  Updated Vital Signs BP 102/87 (BP Location: Right Arm)   Pulse 95   Temp 97.8 F (36.6 C) (Oral)   Resp 16   SpO2 98%   Physical Exam Vitals and nursing note reviewed.  Constitutional:      General: He is not in acute distress.    Appearance: Normal appearance. He is normal weight. He is not ill-appearing, toxic-appearing or diaphoretic.     Comments: Patient initially asleep and easily rousable.   HENT:     Head: Normocephalic and atraumatic.     Nose: Nose normal.  Eyes:     General: No scleral icterus.    Extraocular Movements: Extraocular movements intact.      Conjunctiva/sclera: Conjunctivae normal.  Cardiovascular:     Rate and Rhythm: Normal rate and regular rhythm.  Pulmonary:     Effort: Pulmonary effort is normal. No respiratory distress.  Musculoskeletal:        General: Normal range of motion.     Cervical back: Normal range of motion.  Skin:    General: Skin is warm and dry.     Coloration: Skin is not jaundiced or pale.     Comments: Superficial laceration with small skin flap to left index finger on palmar aspect. Bleeding controlled. About 0.5 cm in length. No involvement of the fingernail.  Neurological:     Mental Status: He is alert and oriented to person, place, and time.     (all labs ordered are listed, but only abnormal results are displayed) Labs Reviewed - No data to display  EKG: None  Radiology: No results found.   .Laceration Repair  Date/Time: 05/07/2024 7:21 AM  Performed by: Rosina Norris LABOR, PA-C Authorized by: Rosina Norris LABOR, PA-C   Consent:    Consent obtained:  Verbal   Consent given by:  Patient   Risks, benefits, and alternatives were discussed: yes     Risks discussed:  Need for additional repair, infection, nerve damage, poor wound healing, poor cosmetic result, pain, retained foreign body, tendon damage and vascular damage  Alternatives discussed:  No treatment, delayed treatment, observation and referral Universal protocol:    Procedure explained and questions answered to patient or proxy's satisfaction: yes     Relevant documents present and verified: yes     Test results available: yes     Imaging studies available: yes     Site/side marked: yes     Immediately prior to procedure, a time out was called: yes     Patient identity confirmed:  Verbally with patient, hospital-assigned identification number and provided demographic data Anesthesia:    Anesthesia method:  None Laceration details:    Location:  Finger   Finger location:  L index finger   Length (cm):   0.5 Pre-procedure details:    Preparation:  Patient was prepped and draped in usual sterile fashion Exploration:    Hemostasis achieved with:  Direct pressure   Wound exploration: entire depth of wound visualized   Treatment:    Area cleansed with:  Saline   Amount of cleaning:  Standard   Irrigation solution:  Sterile saline   Irrigation method:  Pressure wash   Visualized foreign bodies/material removed: no   Skin repair:    Repair method:  Tissue adhesive Repair type:    Repair type:  Simple Post-procedure details:    Dressing:  Bulky dressing   Procedure completion:  Tolerated    Medications Ordered in the ED - No data to display   Patient presents to the ED for concern of laceration, this involves an extensive number of treatment options, and is a complaint that carries with it a high risk of complications and morbidity.  The differential diagnosis includes laceration, foreign body, secondary infection.   Additional history obtained:  Additional history obtained from EMS   External records from outside source obtained and reviewed including EMS report.   Medicines ordered and prescription drug management:  I ordered medication including dermabond  for laceration repair.  Reevaluation of the patient after these medicines showed that the patient improved I have reviewed the patients home medicines and have made adjustments as needed   Problem List / ED Course:     Laceration to left index finger. Small superficial laceration to left index finger with no evidence of foreign body repaired using dermabond. Return precautions discussed with patient and patient verbalized understanding. Patient given sandwich with discharge paperwork. Stable for discharge.   Reevaluation:  After the interventions noted above, I reevaluated the patient and found that they have :improved   Social Determinants of Health:  Housing instability   Dispostion:  After consideration of  the diagnostic results and the patients response to treatment, I feel that the patent would benefit from supportive care in the home setting and follow-up with primary care as needed. Return precautions given.                                   Medical Decision Making  This note was produced using Dragon Medical voice recognition. While the provider has reviewed and verified all clinical information, transcription errors may remain.    Final diagnoses:  Laceration of left index finger without foreign body without damage to nail, initial encounter    ED Discharge Orders     None          Rosina Almarie LABOR, PA-C 05/07/24 0805    Rosina Almarie LABOR, PA-C 05/07/24 [phone]  "

## 2024-05-18 ENCOUNTER — Emergency Department (HOSPITAL_COMMUNITY)
Admission: EM | Admit: 2024-05-18 | Discharge: 2024-05-18 | Disposition: A | Discharge status: Home / Self Care | Payer: MEDICAID | Attending: Emergency Medicine | Admitting: Emergency Medicine

## 2024-05-18 ENCOUNTER — Encounter (HOSPITAL_COMMUNITY): Payer: Self-pay

## 2024-05-18 ENCOUNTER — Other Ambulatory Visit: Payer: Self-pay

## 2024-05-18 DIAGNOSIS — X31XXXA Exposure to excessive natural cold, initial encounter: Secondary | ICD-10-CM | POA: Insufficient documentation

## 2024-05-18 DIAGNOSIS — T699XXA Effect of reduced temperature, unspecified, initial encounter: Secondary | ICD-10-CM | POA: Diagnosis present

## 2024-05-18 NOTE — ED Provider Notes (Signed)
 " [place] EMERGENCY DEPARTMENT AT [facility] Provider Note   CSN: [id number] Arrival date & time: 05/18/24  0258     History Chief Complaint  Patient presents with   Frostbite    HPI David Lloyd is a 34 y.o. male presenting for complaint of toe pain. States he is been out in the snow today and that his right foot great toe and left foot pinky toe are both very cold to the touch.  Allegedly purple with EMS.  Treated with warm blankets and heating packs prior to arrival.  He states that is getting better already.   Patient's recorded medical, surgical, social, medication list and allergies were reviewed in the Snapshot window as part of the initial history.   Review of Systems   Review of Systems  Constitutional:  Negative for chills and fever.  HENT:  Negative for ear pain and sore throat.   Eyes:  Negative for pain and visual disturbance.  Respiratory:  Negative for cough and shortness of breath.   Cardiovascular:  Negative for chest pain and palpitations.  Gastrointestinal:  Negative for abdominal pain and vomiting.  Genitourinary:  Negative for dysuria and hematuria.  Musculoskeletal:  Negative for arthralgias and back pain.  Skin:  Positive for color change. Negative for rash.  Neurological:  Negative for seizures and syncope.  All other systems reviewed and are negative.   Physical Exam Updated Vital Signs BP 127/82   Pulse (!) 123   Temp 98.1 F (36.7 C) (Oral)   Resp 17   SpO2 99%  Physical Exam Vitals and nursing note reviewed.  Constitutional:      General: He is not in acute distress.    Appearance: He is well-developed.  HENT:     Head: Normocephalic and atraumatic.  Eyes:     Conjunctiva/sclera: Conjunctivae normal.  Cardiovascular:     Rate and Rhythm: Normal rate and regular rhythm.  Pulmonary:     Effort: Pulmonary effort is normal. No respiratory distress.  Abdominal:     General: Abdomen is flat. There is no distension.   Musculoskeletal:        General: No swelling or deformity.  Skin:    General: Skin is warm and dry.     Capillary Refill: Capillary refill takes less than 2 seconds.  Neurological:     Mental Status: He is alert and oriented to person, place, and time. Mental status is at baseline.         ED Course/ Medical Decision Making/ A&P    Procedures Procedures   Medications Ordered in ED Medications - No data to display  Medical Decision Making:   34 year old male reporting frostbite. It is very cold and wet outside with approximate 1 foot of snow overnight. Appears to be just superficial as symptoms already improving with warming.  Will continue warming overnight plan for reassessment of symptoms in the morning.  He is moving all extremities and they are currently well-perfused.  Reassessment: On reassessment patient is in no acute distress.  Extremity is now rewarmed.  Still fits presentation of superficial disease. Supportive care reinforced, patient provided local resources to prevent progression and educated on risk of severe disease should he continue with exposure.  Patient stated I do not like being around people so I do not know if I am in a go to a shelter Patient again educated on risk of losing his feet should he continue to keep them in a cold environment. Provided with donated  shoes.   Disposition:  I have considered need for hospitalization, however, considering all of the above, I believe this patient is stable for discharge at this time.  Patient/family educated about specific return precautions for given chief complaint and symptoms.  Patient/family educated about follow-up with PC.     Patient/family expressed understanding of return precautions and need for follow-up. Patient spoken to regarding all imaging and laboratory results and appropriate follow up for these results. All education provided in verbal form with additional information in written form. Time  was allowed for answering of patient questions. Patient discharged.    Emergency Department Medication Summary:   Medications - No data to display       Clinical Impression:  1. Cold exposure, initial encounter      Discharge   Final Clinical Impression(s) / ED Diagnoses Final diagnoses:  Cold exposure, initial encounter    Rx / DC Orders ED Discharge Orders     None         Jerral Meth, MD 05/18/24 [phone]  "

## 2024-05-18 NOTE — ED Triage Notes (Signed)
 Pt BIBEMS d/t being in the environment without shoes for >24 hrs. Big toes and pinkies mostly affected. Patient feels tingling sensation with touch. EMS stated feet are purplish in color.  Bp: 130/90 P: 130 O2: 98%
